# Patient Record
Sex: Female | Born: 1965 | Race: White | Hispanic: No | Marital: Married | Smoking: Current every day smoker
Health system: Southern US, Community
[De-identification: ages and names within clinical notes are randomized; demographics above are authoritative.]

## PROBLEM LIST (undated history)

## (undated) DIAGNOSIS — I10 Essential (primary) hypertension: Secondary | ICD-10-CM

## (undated) DIAGNOSIS — F419 Anxiety disorder, unspecified: Secondary | ICD-10-CM

## (undated) HISTORY — PX: TONSILLECTOMY: SUR1361

## (undated) HISTORY — PX: BILATERAL SALPINGECTOMY: SHX5743

---

## 2002-10-28 ENCOUNTER — Emergency Department (HOSPITAL_COMMUNITY): Admission: EM | Admit: 2002-10-28 | Discharge: 2002-10-28 | Payer: Self-pay | Admitting: Emergency Medicine

## 2010-01-29 ENCOUNTER — Emergency Department (HOSPITAL_COMMUNITY): Admission: EM | Admit: 2010-01-29 | Discharge: 2010-01-29 | Payer: Self-pay | Admitting: Emergency Medicine

## 2010-03-07 ENCOUNTER — Emergency Department: Payer: Self-pay | Admitting: Emergency Medicine

## 2010-04-26 ENCOUNTER — Emergency Department: Payer: Self-pay | Admitting: Emergency Medicine

## 2014-01-17 ENCOUNTER — Ambulatory Visit: Payer: Self-pay | Admitting: Internal Medicine

## 2014-06-21 ENCOUNTER — Emergency Department: Payer: Self-pay | Admitting: Emergency Medicine

## 2015-08-01 ENCOUNTER — Other Ambulatory Visit: Payer: Self-pay | Admitting: Family Medicine

## 2015-08-01 DIAGNOSIS — Z1231 Encounter for screening mammogram for malignant neoplasm of breast: Secondary | ICD-10-CM

## 2015-08-04 ENCOUNTER — Ambulatory Visit
Admission: RE | Admit: 2015-08-04 | Discharge: 2015-08-04 | Disposition: A | Discharge status: Home / Self Care | Payer: BLUE CROSS/BLUE SHIELD | Source: Ambulatory Visit | Attending: Family Medicine | Admitting: Family Medicine

## 2015-08-04 DIAGNOSIS — Z1231 Encounter for screening mammogram for malignant neoplasm of breast: Secondary | ICD-10-CM | POA: Diagnosis not present

## 2015-08-08 DIAGNOSIS — F418 Other specified anxiety disorders: Secondary | ICD-10-CM | POA: Insufficient documentation

## 2015-08-08 DIAGNOSIS — F17200 Nicotine dependence, unspecified, uncomplicated: Secondary | ICD-10-CM | POA: Insufficient documentation

## 2015-08-08 DIAGNOSIS — I10 Essential (primary) hypertension: Secondary | ICD-10-CM | POA: Insufficient documentation

## 2015-08-08 DIAGNOSIS — R9431 Abnormal electrocardiogram [ECG] [EKG]: Secondary | ICD-10-CM | POA: Insufficient documentation

## 2016-02-15 ENCOUNTER — Emergency Department
Admission: EM | Admit: 2016-02-15 | Discharge: 2016-02-15 | Disposition: A | Discharge status: Home / Self Care | Payer: BLUE CROSS/BLUE SHIELD | Attending: Emergency Medicine | Admitting: Emergency Medicine

## 2016-02-15 ENCOUNTER — Emergency Department: Payer: BLUE CROSS/BLUE SHIELD

## 2016-02-15 DIAGNOSIS — R109 Unspecified abdominal pain: Secondary | ICD-10-CM | POA: Diagnosis present

## 2016-02-15 DIAGNOSIS — M545 Low back pain, unspecified: Secondary | ICD-10-CM

## 2016-02-15 DIAGNOSIS — Z79899 Other long term (current) drug therapy: Secondary | ICD-10-CM | POA: Insufficient documentation

## 2016-02-15 DIAGNOSIS — F17210 Nicotine dependence, cigarettes, uncomplicated: Secondary | ICD-10-CM | POA: Diagnosis not present

## 2016-02-15 HISTORY — DX: Anxiety disorder, unspecified: F41.9

## 2016-02-15 LAB — URINALYSIS COMPLETE WITH MICROSCOPIC (ARMC ONLY)
BILIRUBIN URINE: NEGATIVE
Bacteria, UA: NONE SEEN
GLUCOSE, UA: NEGATIVE mg/dL
HGB URINE DIPSTICK: NEGATIVE
LEUKOCYTES UA: NEGATIVE
NITRITE: NEGATIVE
Protein, ur: 30 mg/dL — AB
SPECIFIC GRAVITY, URINE: 1.027 (ref 1.005–1.030)
pH: 5 (ref 5.0–8.0)

## 2016-02-15 LAB — CBC
HCT: 44.5 % (ref 35.0–47.0)
Hemoglobin: 15.2 g/dL (ref 12.0–16.0)
MCH: 30 pg (ref 26.0–34.0)
MCHC: 34.1 g/dL (ref 32.0–36.0)
MCV: 87.9 fL (ref 80.0–100.0)
PLATELETS: 255 10*3/uL (ref 150–440)
RBC: 5.06 MIL/uL (ref 3.80–5.20)
RDW: 14.5 % (ref 11.5–14.5)
WBC: 9.7 10*3/uL (ref 3.6–11.0)

## 2016-02-15 LAB — COMPREHENSIVE METABOLIC PANEL
ALK PHOS: 60 U/L (ref 38–126)
ALT: 20 U/L (ref 14–54)
AST: 25 U/L (ref 15–41)
Albumin: 4.5 g/dL (ref 3.5–5.0)
Anion gap: 9 (ref 5–15)
BILIRUBIN TOTAL: 0.5 mg/dL (ref 0.3–1.2)
BUN: 18 mg/dL (ref 6–20)
CALCIUM: 9.2 mg/dL (ref 8.9–10.3)
CHLORIDE: 104 mmol/L (ref 101–111)
CO2: 26 mmol/L (ref 22–32)
CREATININE: 0.9 mg/dL (ref 0.44–1.00)
Glucose, Bld: 91 mg/dL (ref 65–99)
Potassium: 4.1 mmol/L (ref 3.5–5.1)
Sodium: 139 mmol/L (ref 135–145)
TOTAL PROTEIN: 7.6 g/dL (ref 6.5–8.1)

## 2016-02-15 LAB — LIPASE, BLOOD: LIPASE: 21 U/L (ref 11–51)

## 2016-02-15 LAB — PREGNANCY, URINE: Preg Test, Ur: NEGATIVE

## 2016-02-15 MED ORDER — KETOROLAC TROMETHAMINE 60 MG/2ML IM SOLN
30.0000 mg | Freq: Once | INTRAMUSCULAR | Status: AC
  Administered 2016-02-15: 30 mg via INTRAMUSCULAR

## 2016-02-15 MED ORDER — HYDROCODONE-ACETAMINOPHEN 5-325 MG PO TABS: 1.0000 | ORAL_TABLET | ORAL | 0 refills | Status: DC | PRN

## 2016-02-15 MED ORDER — DOCUSATE SODIUM 100 MG PO CAPS: ORAL_CAPSULE | ORAL | 0 refills | Status: DC

## 2016-02-15 MED ORDER — KETOROLAC TROMETHAMINE 30 MG/ML IJ SOLN
INTRAMUSCULAR | Status: AC
  Administered 2016-02-15: 30 mg via INTRAMUSCULAR
  Filled 2016-02-15: qty 1

## 2016-02-15 NOTE — Discharge Instructions (Signed)
As we discussed, your workup today was reassuring.  Though we do not know exactly what is causing your symptoms, it appears that you have no emergent medical condition at this time and that you are safe to go home and follow up as recommended in this paperwork.  You most likely have muscle strain, and we encourage you to read through the included information and try some of the therapies including over-the-counter ibuprofen and heating pads.  Take Norco as prescribed for severe pain. Do not drink alcohol, drive or participate in any other potentially dangerous activities while taking this medication as it may make you sleepy. Do not take this medication with any other sedating medications, either prescription or over-the-counter. If you were prescribed Percocet or Vicodin, do not take these with acetaminophen (Tylenol) as it is already contained within these medications.   This medication is an opiate (or narcotic) pain medication and can be habit forming.  Use it as little as possible to achieve adequate pain control.  Do not use or use it with extreme caution if you have a history of opiate abuse or dependence.  If you are on a pain contract with your primary care doctor or a pain specialist, be sure to let them know you were prescribed this medication today from the [facility].  This medication is intended for your use only - do not give any to anyone else and keep it in a secure place where nobody else, especially children, have access to it.  It will also cause or worsen constipation, so you may want to consider taking an over-the-counter stool softener while you are taking this medication.  Please return immediately to the Emergency Department if you develop any new or worsening symptoms that concern you.

## 2016-02-15 NOTE — ED Provider Notes (Signed)
[facility] Emergency Department Provider Note  ____________________________________________   First MD Initiated Contact with Patient 02/15/16 1815     (approximate)  I have reviewed the triage vital signs and the nursing notes.   HISTORY  Chief Complaint Abdominal Pain and Flank Pain    HPI Miranda Harvey is a 50 y.o. female who reports acute onset of pain that seems to go from her right flank down to her right groin.  It has been present off and on for about 2 days.  She reports it is anywhere from mild to severe.  It is worse with ambulation and weightbearing and better at rest, although it does not only happened when she was moving around.  She describes it as both sharp and aching.  She denies dysuria, hematuria, nausea, vomiting, fever/chills, chest pain, shortness of breath, upper abdominal pain.  She has not had anything like it in the past.   Past Medical History:  Diagnosis Date  . Anxiety     There are no active problems to display for this patient.   Past Surgical History:  Procedure Laterality Date  . BILATERAL SALPINGECTOMY Bilateral     Prior to Admission medications   Medication Sig Start Date End Date Taking? Authorizing Provider  ALPRAZolam Prudy Feeler(XANAX) 0.5 MG tablet Take 0.5 mg by mouth at bedtime as needed for anxiety.   Yes Historical Provider, MD  calcium-vitamin D (OSCAL WITH D) 500-200 MG-UNIT tablet Take 1 tablet by mouth.   Yes Historical Provider, MD  escitalopram (LEXAPRO) 10 MG tablet Take 10 mg by mouth daily.   Yes Historical Provider, MD  losartan (COZAAR) 50 MG tablet Take 50 mg by mouth daily.   Yes Historical Provider, MD  vitamin B-12 (CYANOCOBALAMIN) 100 MCG tablet Take 100 mcg by mouth daily.   Yes Historical Provider, MD  docusate sodium (COLACE) 100 MG capsule Take 1 tablet once or twice daily as needed for constipation while taking narcotic pain medicine 02/15/16   Loleta Roseory Kron Everton, MD  HYDROcodone-acetaminophen  (NORCO/VICODIN) 5-325 MG tablet Take 1-2 tablets by mouth every 4 (four) hours as needed for moderate pain. 02/15/16   Loleta Roseory Carlisle Torgeson, MD    Allergies Review of patient's allergies indicates no known allergies.  Family History  Problem Relation Age of Onset  . Diabetes Son   . Cancer Maternal Grandfather   . Cancer Cousin   . Breast cancer Neg Hx     Social History Social History  Substance Use Topics  . Smoking status: Current Every Day Smoker    Packs/day: 1.50    Years: 17.00    Types: Cigarettes  . Smokeless tobacco: Never Used  . Alcohol use No    Review of Systems Constitutional: No fever/chills Eyes: No visual changes. ENT: No sore throat. Cardiovascular: Denies chest pain. Respiratory: Denies shortness of breath. Gastrointestinal: Pain radiating in and around her right flank down to her right groin Genitourinary: Negative for dysuria.  Negative for hematuria. Musculoskeletal: Negative for back pain. Skin: Negative for rash. Neurological: Negative for headaches, focal weakness or numbness.  10-point ROS otherwise negative.  ____________________________________________   PHYSICAL EXAM:  VITAL SIGNS: ED Triage Vitals  Enc Vitals Group     BP 02/15/16 1655 (!) 150/92     Pulse Rate 02/15/16 1655 73     Resp 02/15/16 1655 18     Temp 02/15/16 1655 98.5 F (36.9 C)     Temp Source 02/15/16 1655 Oral     SpO2 02/15/16  1655 97 %     Weight 02/15/16 1655 165 lb (74.8 kg)     Height 02/15/16 1655 5\' 5"  (1.651 m)     Head Circumference --      Peak Flow --      Pain Score 02/15/16 1656 10     Pain Loc --      Pain Edu? --      Excl. in GC? --     Constitutional: Alert and oriented. Well appearing and in no acute distress. Eyes: Conjunctivae are normal. PERRL. EOMI. Head: Atraumatic. Nose: No congestion/rhinnorhea. Mouth/Throat: Mucous membranes are moist.  Oropharynx non-erythematous. Neck: No stridor.  No meningeal signs.   Cardiovascular: Normal  rate, regular rhythm. Good peripheral circulation. Grossly normal heart sounds. Respiratory: Normal respiratory effort.  No retractions. Lungs CTAB. Gastrointestinal: Soft With no right lower quadrant tenderness to palpation.  She has right CVA tenderness that is moderate and easily reproducible with palpation and percussion.  She has no evidence of an inguinal or abdominal hernia. Musculoskeletal: No lower extremity tenderness nor edema. No gross deformities of extremities. Neurologic:  Normal speech and language. No gross focal neurologic deficits are appreciated.  Skin:  Skin is warm, dry and intact. No rash noted. Psychiatric: Mood and affect are normal. Speech and behavior are normal.  ____________________________________________   LABS (all labs ordered are listed, but only abnormal results are displayed)  Labs Reviewed  URINALYSIS COMPLETEWITH MICROSCOPIC ([facility] ONLY) - Abnormal; Notable for the following:       Result Value   Color, Urine YELLOW (*)    APPearance CLEAR (*)    Ketones, ur TRACE (*)    Protein, ur 30 (*)    Squamous Epithelial / LPF 0-5 (*)    All other components within normal limits  LIPASE, BLOOD  COMPREHENSIVE METABOLIC PANEL  CBC  PREGNANCY, URINE   ____________________________________________  EKG  None - EKG not ordered by ED physician ____________________________________________  RADIOLOGY   Ct Renal Stone Study  Result Date: 02/15/2016 CLINICAL DATA:  Right flank pain radiating to right groin. EXAM: CT ABDOMEN AND PELVIS WITHOUT CONTRAST TECHNIQUE: Multidetector CT imaging of the abdomen and pelvis was performed following the standard protocol without IV contrast. COMPARISON:  None. FINDINGS: Lower chest: No acute findings. Gallbladder is unremarkable. Hepatobiliary:  No mass visualized on this unenhanced exam. Pancreas: No mass or inflammatory process visualized on this unenhanced exam. Spleen:  Within normal limits in size. Adrenals/Urinary  tract: No evidence of urolithiasis or hydronephrosis. Unremarkable appearance of bladder. Stomach/Bowel: No evidence of obstruction, inflammatory process, or abnormal fluid collections. Normal appendix visualized. Vascular/Lymphatic: No pathologically enlarged lymph nodes identified. No evidence of abdominal aortic aneurysm. Aortic atherosclerosis. Reproductive:  Normal appearance of uterus and adnexa. Other:  None. Musculoskeletal:  No suspicious bone lesions identified. IMPRESSION: No evidence of urolithiasis, hydronephrosis, or other acute findings within the abdomen or pelvis. Aortic atherosclerosis. Electronically Signed   By: Myles Rosenthal M.D.   On: 02/15/2016 19:11    ____________________________________________   PROCEDURES  Procedure(s) performed:   Procedures   Critical Care performed: No ____________________________________________   INITIAL IMPRESSION / ASSESSMENT AND PLAN / ED COURSE  Pertinent labs & imaging results that were available during my care of the patient were reviewed by me and considered in my medical decision making (see chart for details).  Vital signs are stable and lab work is unremarkable.  Her signs and symptoms suggest kidney stones but she also may simply have musculoskeletal strain given that  it is worse with movement, weightbearing, and palpation.  However given the persistent discomfort I will evaluate with a CT renal study protocol and I am giving her Toradol 30 mg intramuscular to help with her discomfort.  She is comfortable with the plan at this time.   Clinical Course  Comment By Time  CT scan was unremarkable.  The patient's vital signs are stable and the initial triage tachycardia has resolved.  She is feeling better after the Toradol.  I think that musculoskeletal strength is the most likely cause of her reproducible tenderness and pain.  I encouraged her to follow up as an outpatient.  I encouraged her to continue taking ibuprofen and Tylenol  according to label directions I also gave her a prescription for a few Norco.  There is no evidence of acute or emergent medical condition at this time.  Loleta Rose, MD 10/26 1944    ____________________________________________  FINAL CLINICAL IMPRESSION(S) / ED DIAGNOSES  Final diagnoses:  Right flank pain  Low back pain radiating to right lower extremity     MEDICATIONS GIVEN DURING THIS VISIT:  Medications  ketorolac (TORADOL) injection 30 mg (30 mg Intramuscular Given 02/15/16 1850)     NEW OUTPATIENT MEDICATIONS STARTED DURING THIS VISIT:  New Prescriptions   DOCUSATE SODIUM (COLACE) 100 MG CAPSULE    Take 1 tablet once or twice daily as needed for constipation while taking narcotic pain medicine   HYDROCODONE-ACETAMINOPHEN (NORCO/VICODIN) 5-325 MG TABLET    Take 1-2 tablets by mouth every 4 (four) hours as needed for moderate pain.    Modified Medications   No medications on file    Discontinued Medications   No medications on file     Note:  This document was prepared using Dragon voice recognition software and may include unintentional dictation errors.    Loleta Rose, MD 02/15/16 1945

## 2016-02-15 NOTE — ED Triage Notes (Signed)
Right leg pain that radiates to right lower back and right groin, worse with movement X 2 days. Pt alert and oriented X4, active, cooperative, pt in NAD. RR even and unlabored, color WNL.

## 2016-09-05 DIAGNOSIS — I119 Hypertensive heart disease without heart failure: Secondary | ICD-10-CM | POA: Insufficient documentation

## 2016-10-04 DIAGNOSIS — I6523 Occlusion and stenosis of bilateral carotid arteries: Secondary | ICD-10-CM | POA: Insufficient documentation

## 2016-10-04 DIAGNOSIS — E7800 Pure hypercholesterolemia, unspecified: Secondary | ICD-10-CM | POA: Insufficient documentation

## 2016-11-15 ENCOUNTER — Ambulatory Visit (INDEPENDENT_AMBULATORY_CARE_PROVIDER_SITE_OTHER): Payer: BLUE CROSS/BLUE SHIELD | Admitting: Podiatry

## 2016-11-15 ENCOUNTER — Ambulatory Visit: Payer: BLUE CROSS/BLUE SHIELD

## 2016-11-15 VITALS — BP 126/92 | HR 66 | Temp 98.8°F | Resp 16

## 2016-11-15 DIAGNOSIS — L600 Ingrowing nail: Secondary | ICD-10-CM | POA: Diagnosis not present

## 2016-11-15 DIAGNOSIS — M79671 Pain in right foot: Secondary | ICD-10-CM

## 2016-11-15 DIAGNOSIS — M79672 Pain in left foot: Principal | ICD-10-CM

## 2016-11-15 NOTE — Progress Notes (Signed)
Subjective:     Patient ID: Miranda Harvey, female   DOB: September 25, 1965, 51 y.o.   MRN: [id number]  HPI   Review of Systems  Constitutional: Positive for fatigue.  Cardiovascular: Positive for chest pain.  Gastrointestinal: Positive for constipation and diarrhea.  Neurological: Positive for dizziness and numbness.  All other systems reviewed and are negative.      Objective:   Physical Exam     Assessment:         Plan:

## 2016-11-17 NOTE — Progress Notes (Signed)
Patient ID: Miranda Harvey, female   DOB: [birth year], 51 y.o.   MRN: [id number]   Subjective: Patient presents today for evaluation of pain in toe(s). Patient is concerned for possible ingrown nail. Patient states that the pain has been present for a few weeks now. Patient presents today for further treatment and evaluation.  Objective:  General: Well developed, nourished, in no acute distress, alert and oriented x3   Dermatology: Skin is warm, dry and supple bilateral. Medial border of the bilateral great toes appears to be erythematous with evidence of an ingrowing nail. Pain on palpation noted to the border of the nail fold. The remaining nails appear unremarkable at this time. There are no open sores, lesions.  Vascular: Dorsalis Pedis artery and Posterior Tibial artery pedal pulses palpable. No lower extremity edema noted.   Neruologic: Grossly intact via light touch bilateral.  Musculoskeletal: Muscular strength within normal limits in all groups bilateral. Normal range of motion noted to all pedal and ankle joints.   Assesement: #1 Paronychia with ingrowing nail medial border of the bilateral great toes #2 Pain in toe #3 Incurvated nail  Plan of Care:  1. Patient evaluated.  2. Discussed treatment alternatives and plan of care. Explained nail avulsion procedure and post procedure course to patient. 3. Patient opted for permanent partial nail avulsion.  4. Prior to procedure, local anesthesia infiltration utilized using 3 ml of a 50:50 mixture of 2% plain lidocaine and 0.5% plain marcaine in a normal hallux block fashion and a betadine prep performed.  5. Partial permanent nail avulsion with chemical matrixectomy performed using 3x30sec applications of phenol followed by alcohol flush.  6. Light dressing applied. 7. Metatarsal pads were dispensed today to alleviate forefoot pressure unrelated to ingrown toenails 8. Recommend over-the-counter insoles at [employer] 9.Return to  clinic in 2 weeks.   Felecia ShellingBrent M. Latrail Pounders, DPM [facility]  Dr. Felecia ShellingBrent M. Kaymen Adrian, DPM    [address]                                        [place], [place] [address]                Office [phone]  Fax [phone]

## 2017-02-10 ENCOUNTER — Emergency Department
Admission: EM | Admit: 2017-02-10 | Discharge: 2017-02-10 | Disposition: A | Discharge status: Home / Self Care | Payer: BLUE CROSS/BLUE SHIELD | Attending: Emergency Medicine | Admitting: Emergency Medicine

## 2017-02-10 ENCOUNTER — Encounter: Payer: Self-pay | Admitting: Emergency Medicine

## 2017-02-10 DIAGNOSIS — Z79899 Other long term (current) drug therapy: Secondary | ICD-10-CM | POA: Insufficient documentation

## 2017-02-10 DIAGNOSIS — F17210 Nicotine dependence, cigarettes, uncomplicated: Secondary | ICD-10-CM | POA: Diagnosis not present

## 2017-02-10 DIAGNOSIS — I119 Hypertensive heart disease without heart failure: Secondary | ICD-10-CM | POA: Diagnosis not present

## 2017-02-10 DIAGNOSIS — R197 Diarrhea, unspecified: Secondary | ICD-10-CM

## 2017-02-10 LAB — CBC WITH DIFFERENTIAL/PLATELET
BASOS ABS: 0.1 10*3/uL (ref 0–0.1)
Basophils Relative: 1 %
EOS PCT: 3 %
Eosinophils Absolute: 0.2 10*3/uL (ref 0–0.7)
HCT: 41 % (ref 35.0–47.0)
Hemoglobin: 14.2 g/dL (ref 12.0–16.0)
LYMPHS PCT: 18 %
Lymphs Abs: 1.6 10*3/uL (ref 1.0–3.6)
MCH: 30.8 pg (ref 26.0–34.0)
MCHC: 34.6 g/dL (ref 32.0–36.0)
MCV: 89.2 fL (ref 80.0–100.0)
MONO ABS: 0.8 10*3/uL (ref 0.2–0.9)
Monocytes Relative: 9 %
Neutro Abs: 6.2 10*3/uL (ref 1.4–6.5)
Neutrophils Relative %: 69 %
PLATELETS: 282 10*3/uL (ref 150–440)
RBC: 4.6 MIL/uL (ref 3.80–5.20)
RDW: 14.4 % (ref 11.5–14.5)
WBC: 8.9 10*3/uL (ref 3.6–11.0)

## 2017-02-10 LAB — COMPREHENSIVE METABOLIC PANEL
ALT: 16 U/L (ref 14–54)
AST: 19 U/L (ref 15–41)
Albumin: 3.7 g/dL (ref 3.5–5.0)
Alkaline Phosphatase: 79 U/L (ref 38–126)
Anion gap: 7 (ref 5–15)
BUN: 12 mg/dL (ref 6–20)
CHLORIDE: 101 mmol/L (ref 101–111)
CO2: 29 mmol/L (ref 22–32)
Calcium: 9.1 mg/dL (ref 8.9–10.3)
Creatinine, Ser: 1.03 mg/dL — ABNORMAL HIGH (ref 0.44–1.00)
Glucose, Bld: 97 mg/dL (ref 65–99)
POTASSIUM: 3 mmol/L — AB (ref 3.5–5.1)
SODIUM: 137 mmol/L (ref 135–145)
Total Bilirubin: 0.5 mg/dL (ref 0.3–1.2)
Total Protein: 7.2 g/dL (ref 6.5–8.1)

## 2017-02-10 MED ORDER — LOPERAMIDE HCL 2 MG PO TABS: 2.0000 mg | ORAL_TABLET | Freq: Four times a day (QID) | ORAL | 0 refills | Status: DC | PRN

## 2017-02-10 NOTE — ED Provider Notes (Signed)
[facility] Emergency Department Provider Note  Time seen: 10:08 AM  I have reviewed the triage vital signs and the nursing notes.   HISTORY  Chief Complaint Diarrhea    HPI Miranda Harvey is a 51 y.o. female with a past medical history of anxiety, hypertension, presents to the emergency department for diarrhea.  According to the patient for the past 2-3 weeks she has been experiencing daily episodes of diarrhea approximately 5 episodes each day.  Denies any black or blood in her stool.  Denies any nausea or vomiting.  States occasional abdominal cramping but this resolves after a bowel movement.  Denies any "abdominal pain."  Patient states she was at work today and had a leave her job due to diarrhea so she came to the emergency department for evaluation.   Past Medical History:  Diagnosis Date  . Anxiety     Patient Active Problem List   Diagnosis Date Noted  . Bilateral carotid artery stenosis 10/04/2016  . Pure hypercholesterolemia 10/04/2016  . LVH (left ventricular hypertrophy) due to hypertensive disease, without heart failure 09/05/2016  . Abnormal EKG 08/08/2015  . Current smoker 08/08/2015  . Depression with anxiety 08/08/2015  . Essential hypertension 08/08/2015    Past Surgical History:  Procedure Laterality Date  . BILATERAL SALPINGECTOMY Bilateral     Prior to Admission medications   Medication Sig Start Date End Date Taking? Authorizing Provider  ALPRAZolam Prudy Feeler) 0.5 MG tablet Take 0.5 mg by mouth at bedtime as needed for anxiety.    [provider]  escitalopram (LEXAPRO) 20 MG tablet Take by mouth. 09/05/16   [provider]  hydrochlorothiazide (HYDRODIURIL) 25 MG tablet Take by mouth. 10/04/16 10/04/17  [provider]  losartan (COZAAR) 100 MG tablet Take by mouth. 09/05/16   [provider]  pravastatin (PRAVACHOL) 10 MG tablet Take by mouth. 10/04/16 10/04/17  [provider]    No  Known Allergies  Family History  Problem Relation Age of Onset  . Diabetes Son   . Cancer Maternal Grandfather   . Cancer Cousin   . Breast cancer Neg Hx     Social History Social History  Substance Use Topics  . Smoking status: Current Every Day Smoker    Packs/day: 1.50    Years: 17.00    Types: Cigarettes  . Smokeless tobacco: Never Used  . Alcohol use No    Review of Systems Constitutional: Negative for fever. Cardiovascular: Negative for chest pain. Respiratory: Negative for shortness of breath. Gastrointestinal: Intermittent abdominal cramping.  Positive for diarrhea.  Negative for nausea or vomiting Genitourinary: Negative for dysuria. Neurological: Negative for headache All other ROS negative  ____________________________________________   PHYSICAL EXAM:  VITAL SIGNS: ED Triage Vitals  Enc Vitals Group     BP 02/10/17 0715 (!) 129/95     Pulse Rate 02/10/17 0715 70     Resp 02/10/17 0715 18     Temp 02/10/17 0715 98.8 F (37.1 C)     Temp Source 02/10/17 0715 Oral     SpO2 02/10/17 0715 100 %     Weight 02/10/17 0716 179 lb (81.2 kg)     Height 02/10/17 0716 5\' 5"  (1.651 m)     Head Circumference --      Peak Flow --      Pain Score 02/10/17 0715 5     Pain Loc --      Pain Edu? --      Excl. in GC? --  Constitutional: Alert and oriented. Well appearing and in no distress. Eyes: Normal exam ENT   Head: Normocephalic and atraumatic.   Mouth/Throat: Mucous membranes are moist. Cardiovascular: Normal rate, regular rhythm. No murmur Respiratory: Normal respiratory effort without tachypnea nor retractions. Breath sounds are clear  Gastrointestinal: Soft and nontender. No distention.  Musculoskeletal: Nontender with normal range of motion in all extremities.  Neurologic:  Normal speech and language. No gross focal neurologic deficits  Skin:  Skin is warm, dry and intact.  Psychiatric: Mood and affect are normal.    ____________________________________________   INITIAL IMPRESSION / ASSESSMENT AND PLAN / ED COURSE  Pertinent labs & imaging results that were available during my care of the patient were reviewed by me and considered in my medical decision making (see chart for details).  The patient presents to the emergency department with continued diarrhea times 2-3 weeks.  Differential would include C. difficile, infectious/invasive diarrhea, colitis or diverticulitis, gastroenteritis/enteritis.  Overall the patient appears very well, her labs are normal including her white blood cell count.  No signs of significant dehydration on chemistry.  Patient has a nontender abdominal exam.  She does state approximately 3 weeks ago she had an upper respiratory infection in which she took antibiotics.  Given her recent antibiotic usage and continued diarrhea we will check a C. difficile PCR.  We will also obtain stool sample for stool antigen testing.  Patient agreeable to this plan of care.  Patient has been unable to produce a stool sample in the emergency department and is now asking to be discharged home.  Patient's labs are normal otherwise.  I discussed using loperamide and following up with her doctor.  Patient agreeable to this plan.  ____________________________________________   FINAL CLINICAL IMPRESSION(S) / ED DIAGNOSES  Diarrhea    Minna AntisPaduchowski, Nishaan Stanke, MD 02/10/17 1123

## 2017-02-10 NOTE — ED Triage Notes (Signed)
Diarrhea x 3 weeks, only has abdominal pain with cramping type only with episodes of diarrhea, not in between. Denies fevers.

## 2017-02-10 NOTE — ED Notes (Signed)
MD in room to assess patient at this time.   

## 2017-02-10 NOTE — ED Notes (Signed)
Pt came out to door stating that she could not give stool sample at this time and that she wants to go home. Dr. Lenard LancePaduchowski updated.

## 2018-12-16 ENCOUNTER — Other Ambulatory Visit: Payer: Self-pay | Admitting: Family Medicine

## 2018-12-16 DIAGNOSIS — Z1231 Encounter for screening mammogram for malignant neoplasm of breast: Secondary | ICD-10-CM

## 2019-01-22 ENCOUNTER — Encounter: Payer: Self-pay | Admitting: Radiology

## 2019-01-22 ENCOUNTER — Ambulatory Visit
Admission: RE | Admit: 2019-01-22 | Discharge: 2019-01-22 | Disposition: A | Discharge status: Home / Self Care | Payer: BC Managed Care – PPO | Source: Ambulatory Visit | Attending: Family Medicine | Admitting: Family Medicine

## 2019-01-22 DIAGNOSIS — Z1231 Encounter for screening mammogram for malignant neoplasm of breast: Secondary | ICD-10-CM

## 2019-08-06 ENCOUNTER — Ambulatory Visit: Payer: Self-pay | Attending: Internal Medicine

## 2019-08-06 DIAGNOSIS — Z23 Encounter for immunization: Secondary | ICD-10-CM

## 2019-08-06 NOTE — Progress Notes (Signed)
   Covid-19 Vaccination Clinic  Name:  Miranda Harvey    MRN: [id number] DOB: Dec 18, 1965  08/06/2019  [name] was observed post Covid-19 immunization for 15 minutes without incident. She was provided with Vaccine Information Sheet and instruction to access the V-Safe system.   [name] was instructed to call 911 with any severe reactions post vaccine: Marland Kitchen Difficulty breathing  . Swelling of face and throat  . A fast heartbeat  . A bad rash all over body  . Dizziness and weakness   Immunizations Administered    Name Date Dose VIS Date Route   Pfizer COVID-19 Vaccine 08/06/2019  9:40 AM 0.3 mL 04/02/2019 Intramuscular   Manufacturer: [employer], [employer]   Lot: [id number]   NDC: [address]-8

## 2019-08-31 ENCOUNTER — Ambulatory Visit: Payer: Self-pay | Attending: Internal Medicine

## 2019-08-31 DIAGNOSIS — Z23 Encounter for immunization: Secondary | ICD-10-CM

## 2019-08-31 NOTE — Progress Notes (Signed)
   Covid-19 Vaccination Clinic  Name:  Miranda Harvey    MRN: [id number] DOB: 10/21/1965  08/31/2019  [name] was observed post Covid-19 immunization for 15 minutes without incident. She was provided with Vaccine Information Sheet and instruction to access the V-Safe system.   [name] was instructed to call 911 with any severe reactions post vaccine: Marland Kitchen Difficulty breathing  . Swelling of face and throat  . A fast heartbeat  . A bad rash all over body  . Dizziness and weakness   Immunizations Administered    Name Date Dose VIS Date Route   Pfizer COVID-19 Vaccine 08/31/2019  8:42 AM 0.3 mL 06/16/2018 Intramuscular   Manufacturer: [employer], [employer]   Lot: [id number]   NDC: [address]-3

## 2020-01-12 ENCOUNTER — Other Ambulatory Visit: Payer: Self-pay | Admitting: Internal Medicine

## 2020-01-12 DIAGNOSIS — Z1231 Encounter for screening mammogram for malignant neoplasm of breast: Secondary | ICD-10-CM

## 2020-01-31 ENCOUNTER — Other Ambulatory Visit: Payer: Self-pay

## 2020-01-31 ENCOUNTER — Ambulatory Visit
Admission: RE | Admit: 2020-01-31 | Discharge: 2020-01-31 | Disposition: A | Discharge status: Home / Self Care | Payer: BC Managed Care – PPO | Source: Ambulatory Visit | Attending: Internal Medicine | Admitting: Internal Medicine

## 2020-01-31 DIAGNOSIS — Z1231 Encounter for screening mammogram for malignant neoplasm of breast: Secondary | ICD-10-CM

## 2020-11-30 ENCOUNTER — Ambulatory Visit: Payer: Self-pay | Admitting: Orthopedic Surgery

## 2020-12-14 NOTE — Pre-Procedure Instructions (Signed)
Surgical Instructions   Your procedure is scheduled on Thursday, September 1st. Report to [facility] Main Entrance "A" at 05:30 A.M., then check in with the Admitting office. Call this number if you have problems the morning of surgery: [phone]   If you have any questions prior to your surgery date call [phone]: Open Monday-Friday 8am-4pm   Remember: Do not eat after midnight the night before your surgery  You may drink clear liquids until 04:30 the morning of your surgery.   Clear liquids allowed are: Water, Non-Citrus Juices (without pulp), Carbonated Beverages, Clear Tea, Black Coffee with (NO MILK, CREAM OR POWDERED CREAMER), and Gatorade   Patient Instructions  The night before surgery:  No food after midnight. ONLY clear liquids after midnight  The day of surgery (if you do NOT have diabetes):  Drink ONE (1) Pre-Surgery Clear Ensure by 04:30 the morning of surgery. Drink in one sitting. Do not sip.  This drink was given to you during your hospital  pre-op appointment visit.  Nothing else to drink after completing the  Pre-Surgery Clear Ensure.  If you have questions, please contact your surgeon's office.    Take these medicines the morning of surgery with A SIP OF WATER  amLODipine (NORVASC) cetirizine (ZYRTEC)  pravastatin (PRAVACHOL) venlafaxine XR (EFFEXOR-XR)   As of today, STOP taking any Aspirin (unless otherwise instructed by your surgeon) Aleve, Naproxen, Ibuprofen, Motrin, Advil, Goody's, BC's, all herbal medications, fish oil, and all vitamins.           Do not wear jewelry or makeup Do not wear lotions, powders, perfumes or deodorant. Do not shave 48 hours prior to surgery.   Do not bring valuables to the hospital. [facility] is not responsible for any belongings or valuables. DO Not wear nail polish, gel polish, artificial nails, or any other type of covering on natural nails including finger and toenails. If patients have artificial nails,  gel coating, etc. that need to be removed by a nail salon please have this removed prior to surgery or surgery may need to be canceled/delayed if the surgeon/ anesthesia feels like the patient is unable to be adequately monitored.               Do NOT Smoke (Tobacco/Vaping) or drink Alcohol 24 hours prior to your procedure If you use a CPAP at night, you may bring all equipment for your overnight stay.   Contacts, glasses, dentures or bridgework may not be worn into surgery, please bring cases for these belongings   For patients admitted to the hospital, discharge time will be determined by your treatment team.   Patients discharged the day of surgery will not be allowed to drive home, and someone needs to stay with them for 24 hours.  ONLY 1 SUPPORT PERSON MAY BE PRESENT WHILE YOU ARE IN SURGERY. IF YOU ARE TO BE ADMITTED ONCE YOU ARE IN YOUR ROOM YOU WILL BE ALLOWED TWO (2) VISITORS.  Minor children may have two parents present. Special consideration for safety and communication needs will be reviewed on a case by case basis.  Special instructions:    Oral Hygiene is also important to reduce your risk of infection.  Remember - BRUSH YOUR TEETH THE MORNING OF SURGERY WITH YOUR REGULAR TOOTHPASTE   Coffee- Preparing For Surgery  Before surgery, you can play an important role. Because skin is not sterile, your skin needs to be as free of germs as possible. You can reduce the number of  germs on your skin by washing with CHG (chlorahexidine gluconate) Soap before surgery.  CHG is an antiseptic cleaner which kills germs and bonds with the skin to continue killing germs even after washing.     Please do not use if you have an allergy to CHG or antibacterial soaps. If your skin becomes reddened/irritated stop using the CHG.  Do not shave (including legs and underarms) for at least 48 hours prior to first CHG shower. It is OK to shave your face.  Please follow these instructions  carefully.     Shower the NIGHT BEFORE SURGERY and the MORNING OF SURGERY with CHG Soap.   If you chose to wash your hair, wash your hair first as usual with your normal shampoo. After you shampoo, rinse your hair and body thoroughly to remove the shampoo.  Then [employer] and genitals (private parts) with your normal soap and rinse thoroughly to remove soap.  After that Use CHG Soap as you would any other liquid soap. You can apply CHG directly to the skin and wash gently with a scrungie or a clean washcloth.   Apply the CHG Soap to your body ONLY FROM THE NECK DOWN.  Do not use on open wounds or open sores. Avoid contact with your eyes, ears, mouth and genitals (private parts). Wash Face and genitals (private parts)  with your normal soap.   Wash thoroughly, paying special attention to the area where your surgery will be performed.  Thoroughly rinse your body with warm water from the neck down.  DO NOT shower/wash with your normal soap after using and rinsing off the CHG Soap.  Pat yourself dry with a CLEAN TOWEL.  Wear CLEAN PAJAMAS to bed the night before surgery  Place CLEAN SHEETS on your bed the night before your surgery  DO NOT SLEEP WITH PETS.   Day of Surgery:  Take a shower with CHG soap. Wear Clean/Comfortable clothing the morning of surgery Do not apply any deodorants/lotions.   Remember to brush your teeth WITH YOUR REGULAR TOOTHPASTE.   Please read over the following fact sheets that you were given.

## 2020-12-15 ENCOUNTER — Other Ambulatory Visit: Payer: Self-pay

## 2020-12-15 ENCOUNTER — Encounter (HOSPITAL_COMMUNITY): Payer: Self-pay

## 2020-12-15 ENCOUNTER — Ambulatory Visit (HOSPITAL_COMMUNITY)
Admission: RE | Admit: 2020-12-15 | Discharge: 2020-12-15 | Disposition: A | Discharge status: Home / Self Care | Payer: BC Managed Care – PPO | Source: Ambulatory Visit | Attending: Orthopedic Surgery | Admitting: Orthopedic Surgery

## 2020-12-15 ENCOUNTER — Encounter (HOSPITAL_COMMUNITY)
Admission: RE | Admit: 2020-12-15 | Discharge: 2020-12-15 | Disposition: A | Discharge status: Home / Self Care | Payer: BC Managed Care – PPO | Source: Ambulatory Visit | Attending: Specialist | Admitting: Specialist

## 2020-12-15 DIAGNOSIS — M5126 Other intervertebral disc displacement, lumbar region: Secondary | ICD-10-CM

## 2020-12-15 HISTORY — DX: Essential (primary) hypertension: I10

## 2020-12-15 LAB — CBC
HCT: 49.3 % — ABNORMAL HIGH (ref 36.0–46.0)
Hemoglobin: 16.1 g/dL — ABNORMAL HIGH (ref 12.0–15.0)
MCH: 30.1 pg (ref 26.0–34.0)
MCHC: 32.7 g/dL (ref 30.0–36.0)
MCV: 92.3 fL (ref 80.0–100.0)
Platelets: 465 10*3/uL — ABNORMAL HIGH (ref 150–400)
RBC: 5.34 MIL/uL — ABNORMAL HIGH (ref 3.87–5.11)
RDW: 14.3 % (ref 11.5–15.5)
WBC: 11.1 10*3/uL — ABNORMAL HIGH (ref 4.0–10.5)
nRBC: 0 % (ref 0.0–0.2)

## 2020-12-15 LAB — SURGICAL PCR SCREEN
MRSA, PCR: NEGATIVE
Staphylococcus aureus: NEGATIVE

## 2020-12-15 LAB — BASIC METABOLIC PANEL
Anion gap: 8 (ref 5–15)
BUN: 10 mg/dL (ref 6–20)
CO2: 29 mmol/L (ref 22–32)
Calcium: 9.4 mg/dL (ref 8.9–10.3)
Chloride: 102 mmol/L (ref 98–111)
Creatinine, Ser: 0.95 mg/dL (ref 0.44–1.00)
GFR, Estimated: >60 mL/min (ref >60)
Glucose, Bld: 98 mg/dL (ref 70–99)
Potassium: 3.7 mmol/L (ref 3.5–5.1)
Sodium: 139 mmol/L (ref 135–145)

## 2020-12-15 NOTE — Progress Notes (Addendum)
PCP - Enid Baas Cardiologist - denies  Chest x-ray - 12/15/20 EKG - 12/15/20 Stress Test - 2017 ECHO - 2017  ERAS Protcol - yes, Ensure ordered & given   COVID TEST- n/a (ambulatory surgery)   Anesthesia review: yes, history of cardiac workup  Patient denies shortness of breath, fever, cough and chest pain at PAT appointment   All instructions explained to the patient, with a verbal understanding of the material. Patient agrees to go over the instructions while at home for a better understanding. Patient also instructed to self quarantine after being tested for COVID-19. The opportunity to ask questions was provided.

## 2020-12-18 NOTE — Progress Notes (Addendum)
Anesthesia Chart Review:  Case: [id number] Date/Time: 12/21/20 0715   Procedure: Microlumbar decompression L5-S1 left (Left)   Anesthesia type: Choice   Pre-op diagnosis: stenosis L5-S1 left   Location: [facility] ROOM 19 / [facility] OR   Surgeons: Jene Every, MD       DISCUSSION: Patient is a 55 year old female scheduled for the above procedure.   History includes smoking, HTN, anxiety.   She had preoperative medical evaluation by Enid Baas, MD on 11/24/20 (see [facility]). States patient without chest pain or SOB and was able to doe METS of 10 prior to back pain.  Per Dr. Nemiah Commander, "Preop exam- -Labs reviewed, review of systems discussed. -No cardiac risk factors. Low risk for surgery. Can proceed with the Surgery. -Advised to take her blood pressure medications daily -Stay away from aspirin and BC powders."  Her preoperative EKG shows SR, LVH with repolarization abnormality. By notes, this is not new (present ~ 2017-2018) and had unremarkable stress test and echo in 2017. Will attempt to get copy of old EKG tracing(s), otherwise anesthesia to evaluate on the day of surgery. She denies shortness of breath, cough, fever, chest pain at PAT RN visit. (UPDATE 12/19/20 3:29 PM: Received 09/05/16 EKG and 08/09/15 EKGs during stress test from [facility]. Repolarization abnormality is noted on both tracings, but more prominent on pre-stress test EKG 08/09/15, particularly in inferior leads. V6 abnormality is more prominent on 12/15/20 tracing but otherwise I don't think tracing is significantly changed since 09/05/16.)    VS: BP (!) 135/96   Pulse 88   Temp 37.1 C (Oral)   Resp 18   Ht 5\' 6"  (1.676 m)   Wt 83.6 kg   LMP  (LMP Unknown)   SpO2 99%   BMI 29.75 kg/m   PROVIDERS: , MD is PCP  - She is not followed by cardiology routinely, but was evaluated by [facility], MD in 2017 for abnormal EKG, episode of near syncope and left chest numbness, and  SOB. Stress and echo were unremarkable.    LABS: Labs reviewed: Acceptable for surgery. (all labs ordered are listed, but only abnormal results are displayed)  Labs Reviewed  CBC - Abnormal; Notable for the following components:      Result Value   WBC 11.1 (*)    RBC 5.34 (*)    Hemoglobin 16.1 (*)    HCT 49.3 (*)    Platelets 465 (*)    All other components within normal limits  SURGICAL PCR SCREEN  BASIC METABOLIC PANEL     IMAGES: Xray L-spine 12/15/20: FINDINGS: - Slight dextrocurvature of the lower thoracic and lumbar spine. Otherwise normal alignment. Preserved vertebral body heights. Slight disc space narrowing at L2-3 and L5-S1. - Facets are aligned. Aortoiliac bifurcation atherosclerosis noted. Normal SI joints for age. Included pelvis unremarkable. Nonobstructive bowel gas pattern.   IMPRESSION: No acute finding by plain radiography. Degenerative disc disease, as above.   EKG: 12/15/20: Normal sinus rhythm Left ventricular hypertrophy with repolarization abnormality ( Sokolow-Lyon ) Abnormal ECG Confirmed by 12/17/20 (Hillis Range) on 12/15/2020 10:11:48 PM - Attempting to get a copy of 2018 EKG from [facility], but based on Narrative 09/05/16 tracing showed NSR, LVH with repolarization abornality as well which was not significantly changed from 08/08/15 tracing. She had non-ischemic stress test 08/09/15. See DISCUSSION.   CV: Echo 09/01/15 ([facility]): INTERPRETATION  NORMAL LEFT VENTRICULAR SYSTOLIC FUNCTION WITH MILD LVH  MILD VALVULAR REGURGITATION (Mild MR, Mild  AR, Mild TR)  NO VALVULAR STENOSIS  AORTIC VALVE SCLEROTIC  EF 55%    Nuclear stress test 08/09/15 ([facility]): Impression: Normal myocardial perfusion scan no evidence of stress-induced  myocardial ischemia ejection fraction of 52%.  Conclusion negative scan.   Past Medical History:  Diagnosis Date   Anxiety    Hypertension     Past Surgical History:  Procedure Laterality Date    BILATERAL SALPINGECTOMY Bilateral    TONSILLECTOMY      MEDICATIONS:  amLODipine (NORVASC) 10 MG tablet   cetirizine (ZYRTEC) 10 MG tablet   cholecalciferol (VITAMIN D3) 25 MCG (1000 UNIT) tablet   hydrochlorothiazide (HYDRODIURIL) 25 MG tablet   meloxicam (MOBIC) 15 MG tablet   OVER THE COUNTER MEDICATION   pravastatin (PRAVACHOL) 40 MG tablet   venlafaxine XR (EFFEXOR-XR) 37.5 MG 24 hr capsule   vitamin B-12 (CYANOCOBALAMIN) 500 MCG tablet   No current facility-administered medications for this encounter.    Shonna Chock, PA-C Surgical Short Stay/Anesthesiology [facility] Phone [phone] [facility] Phone [phone] 12/18/2020 5:11 PM

## 2020-12-18 NOTE — Anesthesia Preprocedure Evaluation (Addendum)
Anesthesia Evaluation  Patient identified by MRN, date of birth, ID band Patient awake    Reviewed: Allergy & Precautions, NPO status , Patient's Chart, lab work & pertinent test results  Airway Mallampati: II  TM Distance: >3 FB Neck ROM: Full    Dental  (+) Dental Advisory Given, Edentulous Upper, Edentulous Lower   Pulmonary Current SmokerPatient did not abstain from smoking.,    Pulmonary exam normal breath sounds clear to auscultation       Cardiovascular hypertension, Pt. on medications + Peripheral Vascular Disease  Normal cardiovascular exam Rhythm:Regular Rate:Normal     Neuro/Psych PSYCHIATRIC DISORDERS Anxiety Depression  stenosis L5-S1 left    GI/Hepatic negative GI ROS, Neg liver ROS,   Endo/Other  negative endocrine ROS  Renal/GU negative Renal ROS     Musculoskeletal negative musculoskeletal ROS (+)   Abdominal   Peds  Hematology negative hematology ROS (+)   Anesthesia Other Findings   Reproductive/Obstetrics                           Anesthesia Physical Anesthesia Plan  ASA: 3  Anesthesia Plan: General   Post-op Pain Management:    Induction: Intravenous  PONV Risk Score and Plan: 3 and Midazolam, Dexamethasone and Ondansetron  Airway Management Planned: Oral ETT  Additional Equipment:   Intra-op Plan:   Post-operative Plan: Extubation in OR  Informed Consent: I have reviewed the patients History and Physical, chart, labs and discussed the procedure including the risks, benefits and alternatives for the proposed anesthesia with the patient or authorized representative who has indicated his/her understanding and acceptance.     Dental advisory given  Plan Discussed with: CRNA  Anesthesia Plan Comments: (PAT note written 12/18/2020 by Shonna Chock, PA-C. )      Anesthesia Quick Evaluation

## 2020-12-20 ENCOUNTER — Ambulatory Visit: Payer: Self-pay | Admitting: Orthopedic Surgery

## 2020-12-20 NOTE — H&P (Signed)
Miranda Harvey is an 55 y.o. female.   Chief Complaint: back and left leg pain HPI: Reason for Visit: (normal) visit for: (back) Location (Lower Extremity): leg pain on the left, , ; left buttock Severity: pain level 10/10 Aggravating Factors: standing for ; walking for Associated Symptoms: numbness/tingling (LLE) Medications: The patient is taking Percocet and Mobic Notes: The patient is 9 weeks and 5 days out from L L5-S1 ESI She reports she can no longer tolerate at work. Pain is continuing and severe. The epidural was only temporarily helpful  Past Medical History:  Diagnosis Date   Anxiety    Hypertension     Past Surgical History:  Procedure Laterality Date   BILATERAL SALPINGECTOMY Bilateral    TONSILLECTOMY      Family History  Problem Relation Age of Onset   Diabetes Son    Cancer Maternal Grandfather    Cancer Cousin    Breast cancer Neg Hx    Social History:  reports that she has been smoking cigarettes. She has a 17.00 pack-year smoking history. She has never used smokeless tobacco. She reports that she does not drink alcohol and does not use drugs.  Allergies:  Allergies  Allergen Reactions   Penicillins     C-Diff   Medications: amLODIPine 10 mg tablet ergocalciferol (vitamin D2) 1,250 mcg (50,000 unit) capsule hydroCHLOROthiazide 25 mg tablet meloxicam 15 mg tablet oxyCODONE-acetaminophen 5 mg-325 mg tablet pravastatin 40 mg tablet venlafaxine ER 37.5 mg capsule,extended release 24 hr  Review of Systems  Constitutional: Negative.   HENT: Negative.    Eyes: Negative.   Respiratory: Negative.    Cardiovascular: Negative.   Gastrointestinal: Negative.   Endocrine: Negative.   Genitourinary: Negative.   Musculoskeletal:  Positive for back pain and myalgias.  Skin: Negative.   Neurological:  Positive for weakness and numbness.   There were no vitals taken for this visit. Physical Exam Constitutional:      Appearance: Normal appearance.   HENT:     Head: Normocephalic and atraumatic.     Right Ear: External ear normal.     Left Ear: External ear normal.     Nose: Nose normal.     Mouth/Throat:     Pharynx: Oropharynx is clear.  Eyes:     Conjunctiva/sclera: Conjunctivae normal.  Cardiovascular:     Rate and Rhythm: Normal rate and regular rhythm.     Pulses: Normal pulses.     Heart sounds: Normal heart sounds.  Pulmonary:     Effort: Pulmonary effort is normal.  Abdominal:     General: Bowel sounds are normal.  Musculoskeletal:     Cervical back: Normal range of motion.     Comments: Gait and Station: Appearance: ambulating with no assistive devices and antalgic gait.  Constitutional: General Appearance: healthy-appearing and distress (mild).  Psychiatric: Mood and Affect: active and alert.  Cardiovascular System: Edema Right: none; Dorsalis and posterior tibial pulses 2+. Edema Left: none.  Abdomen: Inspection and Palpation: non-distended and no tenderness.  Skin: Inspection and palpation: no rash.  Lumbar Spine: Inspection: normal alignment. Bony Palpation of the Lumbar Spine: tender at lumbosacral junction.. Bony Palpation of the Right Hip: no tenderness of the greater trochanter and tenderness of the SI joint; Pelvis stable. Bony Palpation of the Left Hip: no tenderness of the greater trochanter and tenderness of the SI joint. Soft Tissue Palpation on the Right: No flank pain with percussion. Active Range of Motion: limited flexion and extention.  Motor Strength: L1   Motor Strength on the Right: hip flexion iliopsoas 5/5. L1 Motor Strength on the Left: hip flexion iliopsoas 5/5. L2-L4 Motor Strength on the Right: knee extension quadriceps 5/5. L2-L4 Motor Strength on the Left: knee extension quadriceps 5/5. L5 Motor Strength on the Right: ankle dorsiflexion tibialis anterior 5/5 and great toe extension extensor hallucis longus 5/5. L5 Motor Strength on the Left: ankle dorsiflexion tibialis anterior 5/5 and great  toe extension extensor hallucis longus 4/5. S1 Motor Strength on the Right: plantar flexion gastrocnemius 4/5. S1 Motor Strength on the Left: plantar flexion gastrocnemius 5/5.  Neurological System: Knee Reflex Right: normal (2). Knee Reflex Left: normal (2). Ankle Reflex Right: normal (2). Ankle Reflex Left: diminished (1). Babinski Reflex Right: plantar reflex absent. Babinski Reflex Left: plantar reflex absent. Sensation on the Right: normal distal extremities. Sensation on the Left: normal distal extremities. Special Tests on the Right: no clonus of the ankle/knee. Special Tests on the Left: no clonus of the ankle/knee and seated straight leg raising test positive.  Skin:    General: Skin is warm and dry.  Neurological:     Mental Status: She is alert.    MRI of her lumbar spine demonstrates a paracentral disc herniation L5-S1 deflecting the S1 nerve root with compression into the lateral recess.  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 This is been refractory to conservative treatment that is included a home exercise program with activity modification. In addition an epidural steroid injection as well as analgesics that have included and required oxycodone and meloxicam.  Plan:  We discussed options living with her symptoms versus lumbar decompression she would like to proceed with the latter.  I had an extensive discussion with the patient concerning the pathology relevant anatomy and treatment options. At this point exhausting conservative treatment and in the presence of a neurologic deficit we discussed microlumbar decompression. I discussed the risks and benefits including bleeding, infection, DVT, PE, anesthetic complications, worsening in their symptoms, improvement in their symptoms, C SF leakage, epidural fibrosis, need for future surgeries such as revision discectomy and  lumbar fusion. I also indicated that this is an operation to basically decompress the nerve root to allow recovery as opposed to fixing a herniated disc and that the incidence of recurrent chest disc herniation can approach 15%. Also that nerve root recovery is variable and may not recover completely.  I discussed the operative course including overnight in the hospital. Immediate ambulation. Follow-up in 2 weeks for suture removal. 6 weeks until healing of the herniation followed by 6 weeks of reconditioning and strengthening of the core musculature. Also discussed the need to employ the concepts of disc pressure management and core motion following the surgery to minimize the risk of recurrent disc herniation. We will obtain preoperative clearance i if necessary and proceed accordingly.   She has an allergy to amoxicillin and she was told she could not take penicillin. We use vancomycin and gentamicin. No history of MRSA no history of DVT. Gave her a note to be out of work if she is no longer tolerating that. She can continue to perform daily ambulation as tolerated. Continue with her home postural modifications. Call if there are any changes.  Plan microlumbar decompression L5-S1 left  Dorothy Spark, PA-C for Dr Shelle Iron 12/20/2020, 2:13 PM

## 2020-12-20 NOTE — H&P (View-Only) (Signed)
Miranda Harvey is an 55 y.o. female.   Chief Complaint: back and left leg pain HPI: Reason for Visit: (normal) visit for: (back) Location (Lower Extremity): leg pain on the left, , ; left buttock Severity: pain level 10/10 Aggravating Factors: standing for ; walking for Associated Symptoms: numbness/tingling (LLE) Medications: The patient is taking Percocet and Mobic Notes: The patient is 9 weeks and 5 days out from L L5-S1 ESI She reports she can no longer tolerate at work. Pain is continuing and severe. The epidural was only temporarily helpful  Past Medical History:  Diagnosis Date   Anxiety    Hypertension     Past Surgical History:  Procedure Laterality Date   BILATERAL SALPINGECTOMY Bilateral    TONSILLECTOMY      Family History  Problem Relation Age of Onset   Diabetes Son    Cancer Maternal Grandfather    Cancer Cousin    Breast cancer Neg Hx    Social History:  reports that she has been smoking cigarettes. She has a 17.00 pack-year smoking history. She has never used smokeless tobacco. She reports that she does not drink alcohol and does not use drugs.  Allergies:  Allergies  Allergen Reactions   Penicillins     C-Diff   Medications: amLODIPine 10 mg tablet ergocalciferol (vitamin D2) 1,250 mcg (50,000 unit) capsule hydroCHLOROthiazide 25 mg tablet meloxicam 15 mg tablet oxyCODONE-acetaminophen 5 mg-325 mg tablet pravastatin 40 mg tablet venlafaxine ER 37.5 mg capsule,extended release 24 hr  Review of Systems  Constitutional: Negative.   HENT: Negative.    Eyes: Negative.   Respiratory: Negative.    Cardiovascular: Negative.   Gastrointestinal: Negative.   Endocrine: Negative.   Genitourinary: Negative.   Musculoskeletal:  Positive for back pain and myalgias.  Skin: Negative.   Neurological:  Positive for weakness and numbness.   There were no vitals taken for this visit. Physical Exam Constitutional:      Appearance: Normal appearance.   HENT:     Head: Normocephalic and atraumatic.     Right Ear: External ear normal.     Left Ear: External ear normal.     Nose: Nose normal.     Mouth/Throat:     Pharynx: Oropharynx is clear.  Eyes:     Conjunctiva/sclera: Conjunctivae normal.  Cardiovascular:     Rate and Rhythm: Normal rate and regular rhythm.     Pulses: Normal pulses.     Heart sounds: Normal heart sounds.  Pulmonary:     Effort: Pulmonary effort is normal.  Abdominal:     General: Bowel sounds are normal.  Musculoskeletal:     Cervical back: Normal range of motion.     Comments: Gait and Station: Appearance: ambulating with no assistive devices and antalgic gait.  Constitutional: General Appearance: healthy-appearing and distress (mild).  Psychiatric: Mood and Affect: active and alert.  Cardiovascular System: Edema Right: none; Dorsalis and posterior tibial pulses 2+. Edema Left: none.  Abdomen: Inspection and Palpation: non-distended and no tenderness.  Skin: Inspection and palpation: no rash.  Lumbar Spine: Inspection: normal alignment. Bony Palpation of the Lumbar Spine: tender at lumbosacral junction.. Bony Palpation of the Right Hip: no tenderness of the greater trochanter and tenderness of the SI joint; Pelvis stable. Bony Palpation of the Left Hip: no tenderness of the greater trochanter and tenderness of the SI joint. Soft Tissue Palpation on the Right: No flank pain with percussion. Active Range of Motion: limited flexion and extention.  Motor Strength: L1  Motor Strength on the Right: hip flexion iliopsoas 5/5. L1 Motor Strength on the Left: hip flexion iliopsoas 5/5. L2-L4 Motor Strength on the Right: knee extension quadriceps 5/5. L2-L4 Motor Strength on the Left: knee extension quadriceps 5/5. L5 Motor Strength on the Right: ankle dorsiflexion tibialis anterior 5/5 and great toe extension extensor hallucis longus 5/5. L5 Motor Strength on the Left: ankle dorsiflexion tibialis anterior 5/5 and great  toe extension extensor hallucis longus 4/5. S1 Motor Strength on the Right: plantar flexion gastrocnemius 4/5. S1 Motor Strength on the Left: plantar flexion gastrocnemius 5/5.  Neurological System: Knee Reflex Right: normal (2). Knee Reflex Left: normal (2). Ankle Reflex Right: normal (2). Ankle Reflex Left: diminished (1). Babinski Reflex Right: plantar reflex absent. Babinski Reflex Left: plantar reflex absent. Sensation on the Right: normal distal extremities. Sensation on the Left: normal distal extremities. Special Tests on the Right: no clonus of the ankle/knee. Special Tests on the Left: no clonus of the ankle/knee and seated straight leg raising test positive.  Skin:    General: Skin is warm and dry.  Neurological:     Mental Status: She is alert.    MRI of her lumbar spine demonstrates a paracentral disc herniation L5-S1 deflecting the S1 nerve root with compression into the lateral recess.  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 This is been refractory to conservative treatment that is included a home exercise program with activity modification. In addition an epidural steroid injection as well as analgesics that have included and required oxycodone and meloxicam.  Plan:  We discussed options living with her symptoms versus lumbar decompression she would like to proceed with the latter.  I had an extensive discussion with the patient concerning the pathology relevant anatomy and treatment options. At this point exhausting conservative treatment and in the presence of a neurologic deficit we discussed microlumbar decompression. I discussed the risks and benefits including bleeding, infection, DVT, PE, anesthetic complications, worsening in their symptoms, improvement in their symptoms, C SF leakage, epidural fibrosis, need for future surgeries such as revision discectomy and  lumbar fusion. I also indicated that this is an operation to basically decompress the nerve root to allow recovery as opposed to fixing a herniated disc and that the incidence of recurrent chest disc herniation can approach 15%. Also that nerve root recovery is variable and may not recover completely.  I discussed the operative course including overnight in the hospital. Immediate ambulation. Follow-up in 2 weeks for suture removal. 6 weeks until healing of the herniation followed by 6 weeks of reconditioning and strengthening of the core musculature. Also discussed the need to employ the concepts of disc pressure management and core motion following the surgery to minimize the risk of recurrent disc herniation. We will obtain preoperative clearance i if necessary and proceed accordingly.   She has an allergy to amoxicillin and she was told she could not take penicillin. We use vancomycin and gentamicin. No history of MRSA no history of DVT. Gave her a note to be out of work if she is no longer tolerating that. She can continue to perform daily ambulation as tolerated. Continue with her home postural modifications. Call if there are any changes.  Plan microlumbar decompression L5-S1 left  Dorothy Spark, PA-C for Dr Shelle Iron 12/20/2020, 2:13 PM

## 2020-12-21 ENCOUNTER — Ambulatory Visit (HOSPITAL_COMMUNITY): Payer: BC Managed Care – PPO | Admitting: Physician Assistant

## 2020-12-21 ENCOUNTER — Ambulatory Visit (HOSPITAL_COMMUNITY): Payer: BC Managed Care – PPO

## 2020-12-21 ENCOUNTER — Other Ambulatory Visit: Payer: Self-pay

## 2020-12-21 ENCOUNTER — Ambulatory Visit (HOSPITAL_COMMUNITY)
Admission: RE | Admit: 2020-12-21 | Discharge: 2020-12-21 | Disposition: A | Discharge status: Home / Self Care | Payer: BC Managed Care – PPO | Attending: Specialist | Admitting: Specialist

## 2020-12-21 ENCOUNTER — Encounter (HOSPITAL_COMMUNITY): Payer: Self-pay | Admitting: Specialist

## 2020-12-21 ENCOUNTER — Encounter (HOSPITAL_COMMUNITY)
Admission: RE | Disposition: A | Discharge status: Home / Self Care | Payer: Self-pay | Source: Home / Self Care | Attending: Specialist

## 2020-12-21 DIAGNOSIS — Z79891 Long term (current) use of opiate analgesic: Secondary | ICD-10-CM | POA: Diagnosis not present

## 2020-12-21 DIAGNOSIS — Z791 Long term (current) use of non-steroidal anti-inflammatories (NSAID): Secondary | ICD-10-CM | POA: Diagnosis not present

## 2020-12-21 DIAGNOSIS — M5117 Intervertebral disc disorders with radiculopathy, lumbosacral region: Secondary | ICD-10-CM | POA: Insufficient documentation

## 2020-12-21 DIAGNOSIS — M5126 Other intervertebral disc displacement, lumbar region: Secondary | ICD-10-CM | POA: Diagnosis present

## 2020-12-21 DIAGNOSIS — M4807 Spinal stenosis, lumbosacral region: Secondary | ICD-10-CM | POA: Insufficient documentation

## 2020-12-21 DIAGNOSIS — Z880 Allergy status to penicillin: Secondary | ICD-10-CM | POA: Insufficient documentation

## 2020-12-21 DIAGNOSIS — Z79899 Other long term (current) drug therapy: Secondary | ICD-10-CM | POA: Diagnosis not present

## 2020-12-21 DIAGNOSIS — F17210 Nicotine dependence, cigarettes, uncomplicated: Secondary | ICD-10-CM | POA: Insufficient documentation

## 2020-12-21 DIAGNOSIS — Z419 Encounter for procedure for purposes other than remedying health state, unspecified: Secondary | ICD-10-CM

## 2020-12-21 HISTORY — PX: LUMBAR LAMINECTOMY/DECOMPRESSION MICRODISCECTOMY: SHX5026

## 2020-12-21 SURGERY — LUMBAR LAMINECTOMY/DECOMPRESSION MICRODISCECTOMY
Anesthesia: General | Laterality: Left

## 2020-12-21 MED ORDER — THROMBIN 20000 UNITS EX SOLR
CUTANEOUS | Status: AC
  Filled 2020-12-21: qty 20000

## 2020-12-21 MED ORDER — LIDOCAINE 2% (20 MG/ML) 5 ML SYRINGE
INTRAMUSCULAR | Status: DC | PRN
Start: 2020-12-21 — End: 2020-12-21
  Administered 2020-12-21: 80 mg via INTRAVENOUS

## 2020-12-21 MED ORDER — NEOSTIGMINE METHYLSULFATE 3 MG/3ML IV SOSY
PREFILLED_SYRINGE | INTRAVENOUS | Status: AC
  Filled 2020-12-21: qty 3

## 2020-12-21 MED ORDER — MIDAZOLAM HCL 2 MG/2ML IJ SOLN
INTRAMUSCULAR | Status: AC
  Filled 2020-12-21: qty 2

## 2020-12-21 MED ORDER — VANCOMYCIN HCL IN DEXTROSE 1-5 GM/200ML-% IV SOLN: 1000.0000 mg | Freq: Once | INTRAVENOUS | Status: DC

## 2020-12-21 MED ORDER — 0.9 % SODIUM CHLORIDE (POUR BTL) OPTIME
TOPICAL | Status: DC | PRN
  Administered 2020-12-21: 1000 mL

## 2020-12-21 MED ORDER — RISAQUAD PO CAPS
1.0000 | ORAL_CAPSULE | Freq: Every day | ORAL | Status: DC
  Filled 2020-12-21: qty 1

## 2020-12-21 MED ORDER — BUPIVACAINE-EPINEPHRINE 0.5% -1:200000 IJ SOLN
INTRAMUSCULAR | Status: DC | PRN
  Administered 2020-12-21: 6 mL

## 2020-12-21 MED ORDER — MENTHOL 3 MG MT LOZG: 1.0000 | LOZENGE | OROMUCOSAL | Status: DC | PRN

## 2020-12-21 MED ORDER — GENTAMICIN SULFATE 40 MG/ML IJ SOLN
1.5000 mg/kg | INTRAVENOUS | Status: AC
  Administered 2020-12-21: 130 mg via INTRAVENOUS
  Filled 2020-12-21: qty 3.25

## 2020-12-21 MED ORDER — POLYETHYLENE GLYCOL 3350 17 G PO PACK: 17.0000 g | PACK | Freq: Every day | ORAL | 0 refills | Status: DC

## 2020-12-21 MED ORDER — EPHEDRINE SULFATE-NACL 50-0.9 MG/10ML-% IV SOSY
PREFILLED_SYRINGE | INTRAVENOUS | Status: DC | PRN
  Administered 2020-12-21 (×2): 5 mg via INTRAVENOUS
  Administered 2020-12-21: 10 mg via INTRAVENOUS

## 2020-12-21 MED ORDER — METHOCARBAMOL 1000 MG/10ML IJ SOLN
500.0000 mg | Freq: Four times a day (QID) | INTRAVENOUS | Status: DC | PRN
  Filled 2020-12-21: qty 5

## 2020-12-21 MED ORDER — OXYCODONE HCL 5 MG PO TABS: 5.0000 mg | ORAL_TABLET | ORAL | 0 refills | Status: DC | PRN

## 2020-12-21 MED ORDER — CHLORHEXIDINE GLUCONATE 0.12 % MT SOLN
15.0000 mL | Freq: Once | OROMUCOSAL | Status: AC
  Administered 2020-12-21: 15 mL via OROMUCOSAL
  Filled 2020-12-21: qty 15

## 2020-12-21 MED ORDER — KCL IN DEXTROSE-NACL 20-5-0.45 MEQ/L-%-% IV SOLN: INTRAVENOUS | Status: DC

## 2020-12-21 MED ORDER — LORATADINE 10 MG PO TABS
10.0000 mg | ORAL_TABLET | Freq: Every day | ORAL | Status: DC
Start: 2020-12-22 — End: 2020-12-21

## 2020-12-21 MED ORDER — PROMETHAZINE HCL 25 MG/ML IJ SOLN: 6.2500 mg | INTRAMUSCULAR | Status: DC | PRN

## 2020-12-21 MED ORDER — DOCUSATE SODIUM 100 MG PO CAPS: 100.0000 mg | ORAL_CAPSULE | Freq: Two times a day (BID) | ORAL | Status: DC

## 2020-12-21 MED ORDER — FENTANYL CITRATE (PF) 250 MCG/5ML IJ SOLN
INTRAMUSCULAR | Status: AC
  Filled 2020-12-21: qty 5

## 2020-12-21 MED ORDER — ORAL CARE MOUTH RINSE: 15.0000 mL | Freq: Once | OROMUCOSAL | Status: AC

## 2020-12-21 MED ORDER — PROPOFOL 10 MG/ML IV BOLUS
INTRAVENOUS | Status: AC
  Filled 2020-12-21: qty 20

## 2020-12-21 MED ORDER — PROPOFOL 10 MG/ML IV BOLUS
INTRAVENOUS | Status: DC | PRN
  Administered 2020-12-21: 150 mg via INTRAVENOUS

## 2020-12-21 MED ORDER — ONDANSETRON HCL 4 MG PO TABS: 4.0000 mg | ORAL_TABLET | Freq: Four times a day (QID) | ORAL | Status: DC | PRN

## 2020-12-21 MED ORDER — DEXMEDETOMIDINE (PRECEDEX) IN NS 20 MCG/5ML (4 MCG/ML) IV SYRINGE
PREFILLED_SYRINGE | INTRAVENOUS | Status: DC | PRN
  Administered 2020-12-21: 8 ug via INTRAVENOUS

## 2020-12-21 MED ORDER — ONDANSETRON HCL 4 MG/2ML IJ SOLN
INTRAMUSCULAR | Status: AC
  Filled 2020-12-21: qty 2

## 2020-12-21 MED ORDER — ACETAMINOPHEN 10 MG/ML IV SOLN
INTRAVENOUS | Status: AC
  Filled 2020-12-21: qty 100

## 2020-12-21 MED ORDER — ROCURONIUM BROMIDE 10 MG/ML (PF) SYRINGE
PREFILLED_SYRINGE | INTRAVENOUS | Status: AC
  Filled 2020-12-21: qty 10

## 2020-12-21 MED ORDER — ONDANSETRON HCL 4 MG/2ML IJ SOLN: 4.0000 mg | Freq: Four times a day (QID) | INTRAMUSCULAR | Status: DC | PRN

## 2020-12-21 MED ORDER — BISACODYL 5 MG PO TBEC: 5.0000 mg | DELAYED_RELEASE_TABLET | Freq: Every day | ORAL | Status: DC | PRN

## 2020-12-21 MED ORDER — ACETAMINOPHEN 325 MG PO TABS: 650.0000 mg | ORAL_TABLET | ORAL | Status: DC | PRN

## 2020-12-21 MED ORDER — DOCUSATE SODIUM 100 MG PO CAPS: 100.0000 mg | ORAL_CAPSULE | Freq: Two times a day (BID) | ORAL | 1 refills | Status: DC | PRN

## 2020-12-21 MED ORDER — BUPIVACAINE-EPINEPHRINE 0.5% -1:200000 IJ SOLN
INTRAMUSCULAR | Status: AC
  Filled 2020-12-21: qty 1

## 2020-12-21 MED ORDER — MIDAZOLAM HCL 5 MG/5ML IJ SOLN
INTRAMUSCULAR | Status: DC | PRN
  Administered 2020-12-21: 2 mg via INTRAVENOUS

## 2020-12-21 MED ORDER — VENLAFAXINE HCL ER 75 MG PO CP24: 75.0000 mg | ORAL_CAPSULE | Freq: Every day | ORAL | Status: DC

## 2020-12-21 MED ORDER — THROMBIN 20000 UNITS EX SOLR
CUTANEOUS | Status: DC | PRN
  Administered 2020-12-21: 20 mL via TOPICAL

## 2020-12-21 MED ORDER — AMLODIPINE BESYLATE 5 MG PO TABS: 10.0000 mg | ORAL_TABLET | Freq: Every day | ORAL | Status: DC

## 2020-12-21 MED ORDER — METHOCARBAMOL 500 MG PO TABS: 500.0000 mg | ORAL_TABLET | Freq: Four times a day (QID) | ORAL | Status: DC | PRN

## 2020-12-21 MED ORDER — ONDANSETRON HCL 4 MG/2ML IJ SOLN
INTRAMUSCULAR | Status: DC | PRN
  Administered 2020-12-21: 4 mg via INTRAVENOUS

## 2020-12-21 MED ORDER — ACETAMINOPHEN 650 MG RE SUPP: 650.0000 mg | RECTAL | Status: DC | PRN

## 2020-12-21 MED ORDER — SUGAMMADEX SODIUM 200 MG/2ML IV SOLN
INTRAVENOUS | Status: DC | PRN
  Administered 2020-12-21: 160 mg via INTRAVENOUS

## 2020-12-21 MED ORDER — SUCCINYLCHOLINE CHLORIDE 200 MG/10ML IV SOSY
PREFILLED_SYRINGE | INTRAVENOUS | Status: AC
  Filled 2020-12-21: qty 10

## 2020-12-21 MED ORDER — LACTATED RINGERS IV SOLN: INTRAVENOUS | Status: DC

## 2020-12-21 MED ORDER — OXYCODONE HCL 5 MG PO TABS
10.0000 mg | ORAL_TABLET | ORAL | Status: DC | PRN
Start: 2020-12-21 — End: 2020-12-21

## 2020-12-21 MED ORDER — ROCURONIUM BROMIDE 100 MG/10ML IV SOLN
INTRAVENOUS | Status: DC | PRN
Start: 2020-12-21 — End: 2020-12-21
  Administered 2020-12-21: 60 mg via INTRAVENOUS
  Administered 2020-12-21: 20 mg via INTRAVENOUS

## 2020-12-21 MED ORDER — PHENYLEPHRINE HCL-NACL 20-0.9 MG/250ML-% IV SOLN
INTRAVENOUS | Status: DC | PRN
  Administered 2020-12-21: 30 ug/min via INTRAVENOUS

## 2020-12-21 MED ORDER — ALUM & MAG HYDROXIDE-SIMETH 200-200-20 MG/5ML PO SUSP: 30.0000 mL | Freq: Four times a day (QID) | ORAL | Status: DC | PRN

## 2020-12-21 MED ORDER — OXYCODONE HCL 5 MG PO TABS: 5.0000 mg | ORAL_TABLET | ORAL | Status: DC | PRN

## 2020-12-21 MED ORDER — FENTANYL CITRATE (PF) 100 MCG/2ML IJ SOLN: 25.0000 ug | INTRAMUSCULAR | Status: DC | PRN

## 2020-12-21 MED ORDER — VITAMIN D 25 MCG (1000 UNIT) PO TABS: 1000.0000 [IU] | ORAL_TABLET | Freq: Every day | ORAL | Status: DC

## 2020-12-21 MED ORDER — METHOCARBAMOL 500 MG PO TABS
500.0000 mg | ORAL_TABLET | Freq: Three times a day (TID) | ORAL | 1 refills | Status: DC | PRN
Start: 2020-12-21 — End: 2023-12-02

## 2020-12-21 MED ORDER — ACETAMINOPHEN 10 MG/ML IV SOLN
1000.0000 mg | INTRAVENOUS | Status: AC
  Administered 2020-12-21: 1000 mg via INTRAVENOUS
  Filled 2020-12-21: qty 100

## 2020-12-21 MED ORDER — PHENOL 1.4 % MT LIQD: 1.0000 | OROMUCOSAL | Status: DC | PRN

## 2020-12-21 MED ORDER — DEXAMETHASONE SODIUM PHOSPHATE 10 MG/ML IJ SOLN
INTRAMUSCULAR | Status: DC | PRN
  Administered 2020-12-21: 10 mg via INTRAVENOUS

## 2020-12-21 MED ORDER — VITAMIN B-12 1000 MCG PO TABS: 500.0000 ug | ORAL_TABLET | Freq: Every day | ORAL | Status: DC

## 2020-12-21 MED ORDER — DEXAMETHASONE SODIUM PHOSPHATE 10 MG/ML IJ SOLN
INTRAMUSCULAR | Status: AC
  Filled 2020-12-21: qty 1

## 2020-12-21 MED ORDER — VANCOMYCIN HCL IN DEXTROSE 1-5 GM/200ML-% IV SOLN
1000.0000 mg | INTRAVENOUS | Status: AC
  Administered 2020-12-21: 1000 mg via INTRAVENOUS
  Filled 2020-12-21: qty 200

## 2020-12-21 MED ORDER — POLYETHYLENE GLYCOL 3350 17 G PO PACK: 17.0000 g | PACK | Freq: Every day | ORAL | Status: DC | PRN

## 2020-12-21 MED ORDER — FENTANYL CITRATE (PF) 100 MCG/2ML IJ SOLN
INTRAMUSCULAR | Status: DC | PRN
  Administered 2020-12-21: 50 ug via INTRAVENOUS
  Administered 2020-12-21: 100 ug via INTRAVENOUS
  Administered 2020-12-21: 25 ug via INTRAVENOUS
  Administered 2020-12-21: 75 ug via INTRAVENOUS

## 2020-12-21 SURGICAL SUPPLY — 57 items
BAG COUNTER SPONGE SURGICOUNT (BAG) ×4 IMPLANT
BAG DECANTER FOR FLEXI CONT (MISCELLANEOUS) ×3 IMPLANT
BAG SURGICOUNT SPONGE COUNTING (BAG) ×2
BAND RUBBER #18 3X1/16 STRL (MISCELLANEOUS) ×6 IMPLANT
BUR RND DIAMOND ELITE 4.0 (BURR) ×2 IMPLANT
BUR RND DIAMOND ELITE 4.0MM (BURR) ×1
BUR STRYKR EGG 5.0 (BURR) IMPLANT
CLEANER TIP ELECTROSURG 2X2 (MISCELLANEOUS) ×3 IMPLANT
CLOSURE WOUND 1/2 X4 (GAUZE/BANDAGES/DRESSINGS) ×1
CNTNR URN SCR LID CUP LEK RST (MISCELLANEOUS) ×1 IMPLANT
CONT SPEC 4OZ STRL OR WHT (MISCELLANEOUS) ×3
DRAPE LAPAROTOMY 100X72X124 (DRAPES) ×3 IMPLANT
DRAPE MICROSCOPE LEICA (MISCELLANEOUS) ×3 IMPLANT
DRAPE SHEET LG 3/4 BI-LAMINATE (DRAPES) ×3 IMPLANT
DRAPE SURG 17X11 SM STRL (DRAPES) ×3 IMPLANT
DRAPE UTILITY XL STRL (DRAPES) ×3 IMPLANT
DRSG AQUACEL AG ADV 3.5X 4 (GAUZE/BANDAGES/DRESSINGS) ×3 IMPLANT
DRSG AQUACEL AG ADV 3.5X 6 (GAUZE/BANDAGES/DRESSINGS) IMPLANT
DRSG TELFA 3X8 NADH (GAUZE/BANDAGES/DRESSINGS) IMPLANT
DURAPREP 26ML APPLICATOR (WOUND CARE) ×3 IMPLANT
DURASEAL SPINE SEALANT 3ML (MISCELLANEOUS) IMPLANT
ELECT BLADE 4.0 EZ CLEAN MEGAD (MISCELLANEOUS) ×3
ELECT REM PT RETURN 9FT ADLT (ELECTROSURGICAL) ×3
ELECTRODE BLDE 4.0 EZ CLN MEGD (MISCELLANEOUS) ×1 IMPLANT
ELECTRODE REM PT RTRN 9FT ADLT (ELECTROSURGICAL) ×1 IMPLANT
GLOVE SURG POLYISO LF SZ7.5 (GLOVE) ×3 IMPLANT
GLOVE SURG POLYISO LF SZ8 (GLOVE) ×6 IMPLANT
GLOVE SURG UNDER POLY LF SZ6.5 (GLOVE) ×3 IMPLANT
GLOVE SURG UNDER POLY LF SZ7 (GLOVE) ×12 IMPLANT
GLOVE SURG UNDER POLY LF SZ7.5 (GLOVE) ×12 IMPLANT
GOWN STRL REUS W/ TWL LRG LVL3 (GOWN DISPOSABLE) ×2 IMPLANT
GOWN STRL REUS W/ TWL XL LVL3 (GOWN DISPOSABLE) ×1 IMPLANT
GOWN STRL REUS W/TWL LRG LVL3 (GOWN DISPOSABLE) ×6
GOWN STRL REUS W/TWL XL LVL3 (GOWN DISPOSABLE) ×3
IV CATH 14GX2 1/4 (CATHETERS) ×3 IMPLANT
KIT BASIN OR (CUSTOM PROCEDURE TRAY) ×3 IMPLANT
NEEDLE 22X1 1/2 (OR ONLY) (NEEDLE) ×3 IMPLANT
NEEDLE SPNL 18GX3.5 QUINCKE PK (NEEDLE) ×6 IMPLANT
PACK LAMINECTOMY NEURO (CUSTOM PROCEDURE TRAY) ×3 IMPLANT
PATTIES SURGICAL .75X.75 (GAUZE/BANDAGES/DRESSINGS) ×3 IMPLANT
SPONGE SURGIFOAM ABS GEL 100 (HEMOSTASIS) ×3 IMPLANT
SPONGE T-LAP 4X18 ~~LOC~~+RFID (SPONGE) ×6 IMPLANT
STAPLER VISISTAT (STAPLE) IMPLANT
STRIP CLOSURE SKIN 1/2X4 (GAUZE/BANDAGES/DRESSINGS) ×2 IMPLANT
SUT NURALON 4 0 TR CR/8 (SUTURE) IMPLANT
SUT PROLENE 3 0 PS 2 (SUTURE) ×3 IMPLANT
SUT VIC AB 1 CT1 27 (SUTURE) ×3
SUT VIC AB 1 CT1 27XBRD ANTBC (SUTURE) ×1 IMPLANT
SUT VIC AB 1-0 CT2 27 (SUTURE) ×3 IMPLANT
SUT VIC AB 2-0 CT1 27 (SUTURE) ×3
SUT VIC AB 2-0 CT1 TAPERPNT 27 (SUTURE) ×1 IMPLANT
SUT VIC AB 2-0 CT2 27 (SUTURE) IMPLANT
SYR 3ML LL SCALE MARK (SYRINGE) ×3 IMPLANT
TOWEL GREEN STERILE (TOWEL DISPOSABLE) ×3 IMPLANT
TOWEL GREEN STERILE FF (TOWEL DISPOSABLE) ×3 IMPLANT
TRAY FOLEY MTR SLVR 16FR STAT (SET/KITS/TRAYS/PACK) ×3 IMPLANT
YANKAUER SUCT BULB TIP NO VENT (SUCTIONS) ×3 IMPLANT

## 2020-12-21 NOTE — Progress Notes (Signed)
Pharmacy Antibiotic Note  Miranda Harvey is a 55 y.o. female admitted on 12/21/2020 for spinal procedure.  Pharmacy has been consulted for vancomycin dosing for surgical prophylaxis.  Received pre-op vanc 1gm IV at 0620.  Patient does not have a drain.  SCr 0.95, CrCL 75 ml/min  Plan: Vanc 1gm IV x 1 this evening Pharmacy will sign off  Height: 5\' 6"  (167.6 cm) Weight: 83.5 kg (184 lb) IBW/kg (Calculated) : 59.3  Temp (24hrs), Avg:97.9 F (36.6 C), Min:97.6 F (36.4 C), Max:98.2 F (36.8 C)  Recent Labs  Lab 12/15/20 0911  WBC 11.1*  CREATININE 0.95    Estimated Creatinine Clearance: 74.6 mL/min (by C-G formula based on SCr of 0.95 mg/dL).    Allergies  Allergen Reactions   Penicillins     C-Diff    Nicolet Griffy D. 12/17/20, PharmD, BCPS, BCCCP 12/21/2020, 12:50 PM

## 2020-12-21 NOTE — Anesthesia Procedure Notes (Signed)
Procedure Name: Intubation Date/Time: 12/21/2020 7:57 AM Performed by: Cy Blamer, CRNA Pre-anesthesia Checklist: Patient identified, Emergency Drugs available, Suction available and Patient being monitored Patient Re-evaluated:Patient Re-evaluated prior to induction Oxygen Delivery Method: Circle system utilized Preoxygenation: Pre-oxygenation with 100% oxygen Induction Type: IV induction Ventilation: Mask ventilation without difficulty Laryngoscope Size: Miller and 2 Grade View: Grade I Tube type: Oral Tube size: 7.0 mm Number of attempts: 1 Airway Equipment and Method: Stylet and Oral airway Placement Confirmation: ETT inserted through vocal cords under direct vision, positive ETCO2 and breath sounds checked- equal and bilateral Secured at: 21 cm Tube secured with: Tape Dental Injury: Teeth and Oropharynx as per pre-operative assessment

## 2020-12-21 NOTE — Progress Notes (Signed)
PT Cancellation Note  Patient Details Name: Miranda Harvey MRN: [id number] DOB: December 07, 1965   Cancelled Treatment:    Reason Eval/Treat Not Completed: PT screened, no needs identified, will sign off Per OT, no skilled PT needs at this time. Please re-consult if needs change.   Farley Ly, PT, DPT  Acute Rehabilitation Services  Pager: [phone] Office: [phone]    Lehman Prom 12/21/2020, 1:44 PM

## 2020-12-21 NOTE — Transfer of Care (Signed)
Immediate Anesthesia Transfer of Care Note  Patient: Miranda Harvey  Procedure(s) Performed: Microlumbar decompression Lumbar five sacral one  left (Left)  Patient Location: PACU  Anesthesia Type:General  Level of Consciousness: awake, oriented and patient cooperative  Airway & Oxygen Therapy: Patient Spontanous Breathing and Patient connected to face mask oxygen  Post-op Assessment: Report given to RN, Post -op Vital signs reviewed and stable, Patient moving all extremities X 4 and Patient able to stick tongue midline  Post vital signs: stable  Last Vitals:  Vitals Value Taken Time  BP 100/70 12/21/20 1009  Temp 36.5 C 12/21/20 1009  Pulse 73 12/21/20 1014  Resp 12 12/21/20 1014  SpO2 99 % 12/21/20 1014  Vitals shown include unvalidated device data.  Last Pain:  Vitals:   12/21/20 1009  TempSrc:   PainSc: Asleep      Patients Stated Pain Goal: 2 (12/21/20 5465)  Complications: No notable events documented.

## 2020-12-21 NOTE — Anesthesia Postprocedure Evaluation (Signed)
Anesthesia Post Note  Patient: Miranda Harvey  Procedure(s) Performed: Microlumbar decompression Lumbar five sacral one  left (Left)     Patient location during evaluation: PACU Anesthesia Type: General Level of consciousness: awake and alert, awake and oriented Pain management: pain level controlled Vital Signs Assessment: post-procedure vital signs reviewed and stable Respiratory status: spontaneous breathing, nonlabored ventilation and respiratory function stable Cardiovascular status: blood pressure returned to baseline and stable Postop Assessment: no apparent nausea or vomiting Anesthetic complications: no   No notable events documented.  Last Vitals:  Vitals:   12/21/20 1052 12/21/20 1133  BP: 102/61 103/65  Pulse: 75 64  Resp: 20 20  Temp: 36.4 C 36.8 C  SpO2: 93% 92%    Last Pain:  Vitals:   12/21/20 1133  TempSrc: Oral  PainSc:                  Cecile Hearing

## 2020-12-21 NOTE — Brief Op Note (Signed)
12/21/2020  9:59 AM  PATIENT:  Miranda Harvey  55 y.o. female  PRE-OPERATIVE DIAGNOSIS:  stenosis Lumbar five sacral one left  POST-OPERATIVE DIAGNOSIS:  stenosis Lumbar five sacral one left  PROCEDURE:  Procedure(s): Microlumbar decompression Lumbar five sacral one  left (Left)  SURGEON:  Surgeon(s) and Role:    Jene Every, MD - Primary  PHYSICIAN ASSISTANT:   ASSISTANTS: Bissell   ANESTHESIA:   general  EBL:  100 mL   BLOOD ADMINISTERED:none  DRAINS: none   LOCAL MEDICATIONS USED:  MARCAINE     SPECIMEN:    DISPOSITION OF SPECIMEN:  N/A  COUNTS:  YES  TOURNIQUET:  * No tourniquets in log *  DICTATION: .Other Dictation: Dictation Number [id number]  PLAN OF CARE: Admit for overnight observation  PATIENT DISPOSITION:  PACU - hemodynamically stable.   Delay start of Pharmacological VTE agent (>24hrs) due to surgical blood loss or risk of bleeding: no

## 2020-12-21 NOTE — Interval H&P Note (Signed)
History and Physical Interval Note:  12/21/2020 7:15 AM  Miranda Harvey  has presented today for surgery, with the diagnosis of stenosis L5-S1 left.  The various methods of treatment have been discussed with the patient and family. After consideration of risks, benefits and other options for treatment, the patient has consented to  Procedure(s): Microlumbar decompression L5-S1 left (Left) as a surgical intervention.  The patient's history has been reviewed, patient examined, no change in status, stable for surgery.  I have reviewed the patient's chart and labs.  Questions were answered to the patient's satisfaction.     Javier Docker

## 2020-12-21 NOTE — Discharge Instructions (Signed)

## 2020-12-21 NOTE — Evaluation (Signed)
Occupational Therapy Evaluation Patient Details Name: Miranda Harvey MRN: [id number] DOB: 10-11-1965 Today's Date: 12/21/2020    History of Present Illness 55 yo F s/p Microdecompression, L5-S1.  PMH includes anxiety and HTN.   Clinical Impression   Patient admitted for the procedure above.  She endorses a resolution of her pain, and believes she is walking better than before.  Mild operative site discomfort with bed mobility, but otherwise is at, or near, her baseline for mobility and ADL completion.  No further rehab needs in the acute setting.  Back precautions reviewed, and all questions answered.  PT advised no acute physical therapy needs.  She is walking the halls and able to ascend stairs without incident.       Follow Up Recommendations  No OT follow up    Equipment Recommendations  None recommended by OT    Recommendations for Other Services       Precautions / Restrictions Precautions Precautions: Back Precaution Booklet Issued: Yes (comment) Precaution Comments: Reviewed Restrictions Weight Bearing Restrictions: No Other Position/Activity Restrictions: No brace needed      Mobility Bed Mobility Overal bed mobility: Modified Independent               Patient Response: Cooperative  Transfers Overall transfer level: Modified independent                    Balance Overall balance assessment: No apparent balance deficits (not formally assessed)                                         ADL either performed or assessed with clinical judgement   ADL Overall ADL's : Modified independent                                             Vision Baseline Vision/History: 0 No visual deficits Patient Visual Report: No change from baseline       Perception     Praxis      Pertinent Vitals/Pain Pain Assessment: Faces Faces Pain Scale: Hurts a little bit Pain Descriptors / Indicators: Operative site guarding Pain  Intervention(s): Monitored during session     Hand Dominance Right   Extremity/Trunk Assessment Upper Extremity Assessment Upper Extremity Assessment: Overall WFL for tasks assessed   Lower Extremity Assessment Lower Extremity Assessment: Overall WFL for tasks assessed   Cervical / Trunk Assessment Cervical / Trunk Assessment: Normal   Communication Communication Communication: No difficulties   Cognition Arousal/Alertness: Awake/alert Behavior During Therapy: WFL for tasks assessed/performed Overall Cognitive Status: Within Functional Limits for tasks assessed                                                      Home Living Family/patient expects to be discharged to:: Private residence Living Arrangements: Spouse/significant other;Children Available Help at Discharge: Family;Available 24 hours/day Type of Home: House Home Access: Stairs to enter [employer] of Steps: 3   Home Layout: One level     Bathroom Shower/Tub: Producer, television/film/video: Standard Bathroom Accessibility: Yes How Accessible: Accessible via walker Home Equipment: Shower seat -  built in          Prior Functioning/Environment Level of Independence: Independent        Comments: continues to drive and works FT        OT Problem List: Impaired balance (sitting and/or standing)      OT Treatment/Interventions:      OT Goals(Current goals can be found in the care plan section) Acute Rehab OT Goals Patient Stated Goal: Return home OT Goal Formulation: With patient Time For Goal Achievement: 12/21/20 Potential to Achieve Goals: Good  OT Frequency:     Barriers to D/C:  None noted          Co-evaluation              AM-PAC OT "6 Clicks" Daily Activity     Outcome Measure Help from another person eating meals?: None Help from another person taking care of personal grooming?: None Help from another person toileting, which includes  using toliet, bedpan, or urinal?: None Help from another person bathing (including washing, rinsing, drying)?: None Help from another person to put on and taking off regular upper body clothing?: None Help from another person to put on and taking off regular lower body clothing?: None 6 Click Score: 24   End of Session Nurse Communication: Mobility status  Activity Tolerance: Patient tolerated treatment well Patient left: in bed;with call bell/phone within reach;with family/visitor present  OT Visit Diagnosis: Unsteadiness on feet (R26.81)                Time: 9163-8466 OT Time Calculation (min): 27 min Charges:  OT General Charges $OT Visit: 1 Visit OT Evaluation $OT Eval Moderate Complexity: 1 Mod OT Treatments $Self Care/Home Management : 8-22 mins  12/21/2020  RP, OTR/L  Acute Rehabilitation Services  Office:  [phone]   Miranda Harvey 12/21/2020, 2:04 PM

## 2020-12-21 NOTE — Op Note (Signed)
NAMEBLEU, Miranda Harvey MEDICAL RECORD NO: [id number] ACCOUNT NO: [id number] DATE OF BIRTH: 12/26/1965 FACILITY: [facility] LOCATION: [facility] PHYSICIAN: Javier Docker, MD  Operative Report   DATE OF PROCEDURE: 12/21/2020  PREOPERATIVE DIAGNOSES:  Spinal stenosis, herniated nucleus pulposus, L5-S1, left.  POSTOPERATIVE DIAGNOSES:  Spinal stenosis, herniated nucleus pulposus, L5-S1, left.  PROCEDURE PERFORMED: 1.  Microdecompression, L5-S1, left. 2.  Foraminotomies, L5-S1, left.  Technical difficulty increased due to the patient's sclerotic bone and transitional segment at L5-S1.  ASSISTANT:  Andrez Grime, PA  HISTORY:  A 55 year old with left lower extremity radicular pain, L5-S1 nerve root distribution, secondary to HNP, lateral recess stenosis and compression of the S1 nerve root.  She had a transitional segment at L5-S1.  Indicated for microdecompression  and diskectomy.  Risks and benefits were discussed including bleeding, infection, damage to neurovascular structures, no change in symptoms, worsening symptoms, DVT, PE, anesthetic complications, etc.  TECHNIQUE:  The patient in supine position.  After induction of adequate general anesthesia, Kefzol and gentamicin, she was placed prone on the Wilson frame.  All bony prominences were well padded.  Lumbar region was prepped and draped in the usual  sterile fashion.  Two 18-gauge spinal needles were utilized to localize the L5-S1 interspace, confirmed with x-ray.  Incision was made from the spinous process of L5-S1.  Subcutaneous tissue was dissected, electrocautery was utilized to achieve  hemostasis.  The patient had ample subcutaneous adipose layer.  Dorsolumbar fascia was divided in line with skin incision.  Paraspinous muscle elevated from the lamina 5 and 1.  McCulloch retractor was placed.  Operating microscope was draped and brought  in the surgical field, with markers at the L5-S1 space and at the transitional space.  The patient  had a very small interlaminar window due to facet hypertrophy on the left.  I used a high-speed diamond bur to remove a portion of the inferior  articulating process of L5, identifying the hypertrophied superior articulating process of S1.  I then used a micro curette to remove a portion of the superior articulating process of S1.  I then placed a Penfield between the nerve root in the lateral  recess.  I then decompressed the lateral recess to the medial border of the pedicle with 2 mm Kerrison and performed a generous foraminotomy of S1.  There was a significant epidural venous plexus encountered.  Electrocautery was utilized to achieve  hemostasis, followed by ligating of these epidural veins.  Following this and the foraminotomy of  L5, decompressed L5 foramen as she had some foraminal stenosis there.  I identified the S1 nerve root, mobilized it medially as it was identified  previously.  Held it with a D'Errico, cauterized venous plexus over the disk space.  HNP was noted.  I performed the annulotomy.  Copious portion of disk material was removed from the disk space with a micropituitary, further mobilized with an Epstein  and a nerve hook and catheter lavage irrigation, removed multiple fragments.  Following this, this significantly decompressed the region.  I checked beneath the thecal sac, the axilla of the root, the foramen of L5 and S1 and the shoulder.  There was no  residual disk herniation noted, other than tethering epidural, it was cauterized and lysed, freed the S1 nerve root.  Following this, I copiously irrigated.  I obtained confirmatory radiographs with a Woodson retractor in the foramen of L5 and S1. I  spoke with the radiologist to confirm the space as there was a transitional segment  there.  We looked at a previous CT scan that was available.  Next, I irrigated it.  No evidence of active bleeding or CSF leakage.  I placed a very small 2 x 2 mm  thrombin-soaked Gelfoam into the lateral  recess.  Copiously irrigated once again.  I then removed the McCulloch retractor and inspected the paraspinous musculature.  No active bleeding.  I then closed the dorsolumbar fascia with 1 Vicryl interrupted  figure-of-eight suture, subcutaneous with multiple 2-0 layers and the skin with Prolene.  Sterile dressing applied, placed supine on the hospital bed, extubated without difficulty and transported to the recovery room in satisfactory condition.  The patient tolerated the procedure well.  No complications.  Assistant, Andrez Grime, [place].  Minimal blood loss.   SHW D: 12/21/2020 10:06:18 am T: 12/21/2020 10:50:00 am  JOB: [id number]/ [id number]

## 2020-12-21 NOTE — Plan of Care (Signed)
Pt doing well. Pt and husband given D/C instructions with verbal understanding. Pt's incision is clean and dry with no sign of infection. Pt's IV was removed prior to D/C. Pt D/C'd home via walking per MD order. Pt is stable @ D/C and has no other needs at this time. Rema Fendt, RN

## 2020-12-22 ENCOUNTER — Encounter (HOSPITAL_COMMUNITY): Payer: Self-pay | Admitting: Specialist

## 2021-11-12 ENCOUNTER — Ambulatory Visit
Admission: RE | Admit: 2021-11-12 | Discharge: 2021-11-12 | Disposition: A | Discharge status: Home / Self Care | Payer: BC Managed Care – PPO | Source: Ambulatory Visit | Attending: Student | Admitting: Student

## 2021-11-12 ENCOUNTER — Other Ambulatory Visit: Payer: Self-pay | Admitting: Student

## 2021-11-12 DIAGNOSIS — M79662 Pain in left lower leg: Secondary | ICD-10-CM | POA: Diagnosis present

## 2022-01-24 ENCOUNTER — Other Ambulatory Visit: Payer: Self-pay | Admitting: Internal Medicine

## 2022-01-24 DIAGNOSIS — Z1231 Encounter for screening mammogram for malignant neoplasm of breast: Secondary | ICD-10-CM

## 2022-02-14 ENCOUNTER — Other Ambulatory Visit: Payer: Self-pay | Admitting: Internal Medicine

## 2022-02-14 ENCOUNTER — Other Ambulatory Visit (HOSPITAL_COMMUNITY): Payer: Self-pay | Admitting: Internal Medicine

## 2022-02-14 ENCOUNTER — Ambulatory Visit
Admission: RE | Admit: 2022-02-14 | Discharge: 2022-02-14 | Disposition: A | Discharge status: Home / Self Care | Payer: BC Managed Care – PPO | Source: Ambulatory Visit | Attending: Internal Medicine | Admitting: Internal Medicine

## 2022-02-14 DIAGNOSIS — R042 Hemoptysis: Secondary | ICD-10-CM

## 2022-02-14 DIAGNOSIS — J189 Pneumonia, unspecified organism: Secondary | ICD-10-CM

## 2022-03-08 ENCOUNTER — Ambulatory Visit
Admission: RE | Admit: 2022-03-08 | Discharge: 2022-03-08 | Disposition: A | Discharge status: Home / Self Care | Payer: BC Managed Care – PPO | Source: Ambulatory Visit | Attending: Internal Medicine | Admitting: Internal Medicine

## 2022-03-08 DIAGNOSIS — Z1231 Encounter for screening mammogram for malignant neoplasm of breast: Secondary | ICD-10-CM | POA: Diagnosis present

## 2022-03-21 ENCOUNTER — Other Ambulatory Visit: Payer: Self-pay | Admitting: Cardiovascular Disease

## 2022-03-21 DIAGNOSIS — J189 Pneumonia, unspecified organism: Secondary | ICD-10-CM

## 2022-04-04 ENCOUNTER — Ambulatory Visit
Admission: RE | Admit: 2022-04-04 | Discharge: 2022-04-04 | Disposition: A | Discharge status: Home / Self Care | Payer: BC Managed Care – PPO | Source: Ambulatory Visit | Attending: Infectious Diseases | Admitting: Infectious Diseases

## 2022-04-04 DIAGNOSIS — J189 Pneumonia, unspecified organism: Secondary | ICD-10-CM | POA: Diagnosis present

## 2022-07-05 IMAGING — CR DG LUMBAR SPINE 2-3V
2 series · 2 of 2 positions shown · non-contrast
Comparison: None available

CLINICAL DATA: Lumbar disc herniation, pre-admission exam for
lumbar spine surgery

EXAM:
LUMBAR SPINE - 2-3 VIEW

[w lumbar spine ap]
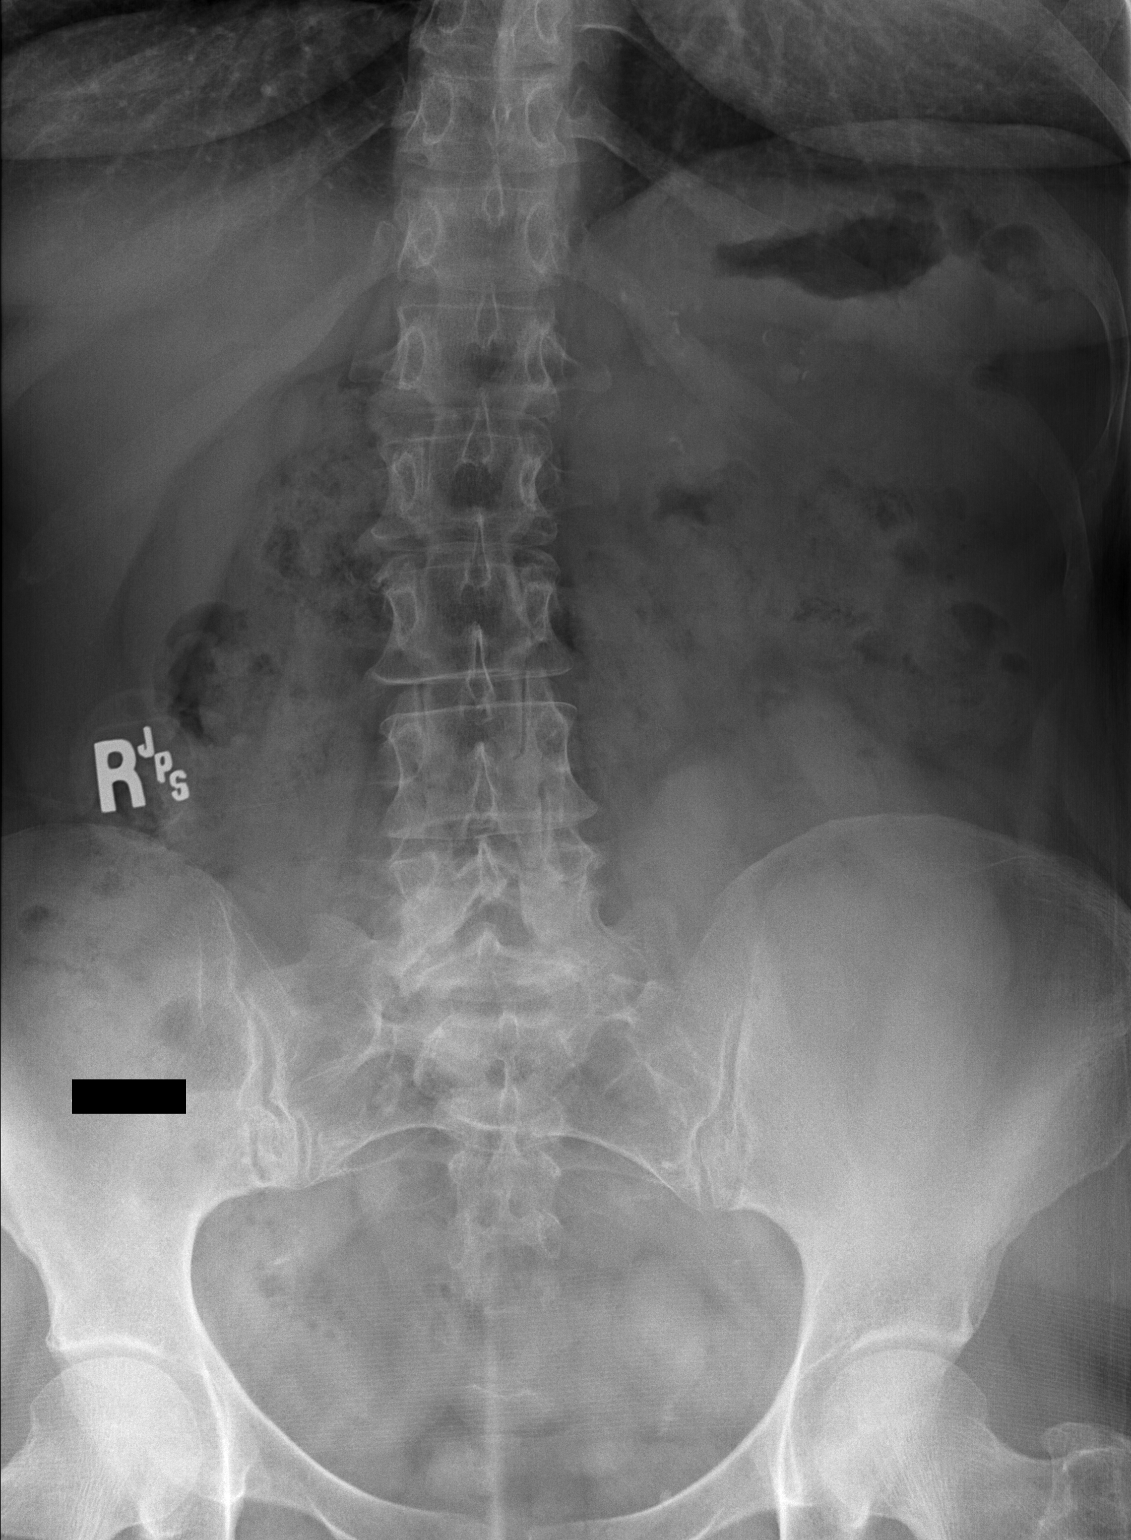

[w lumbar spine lat]
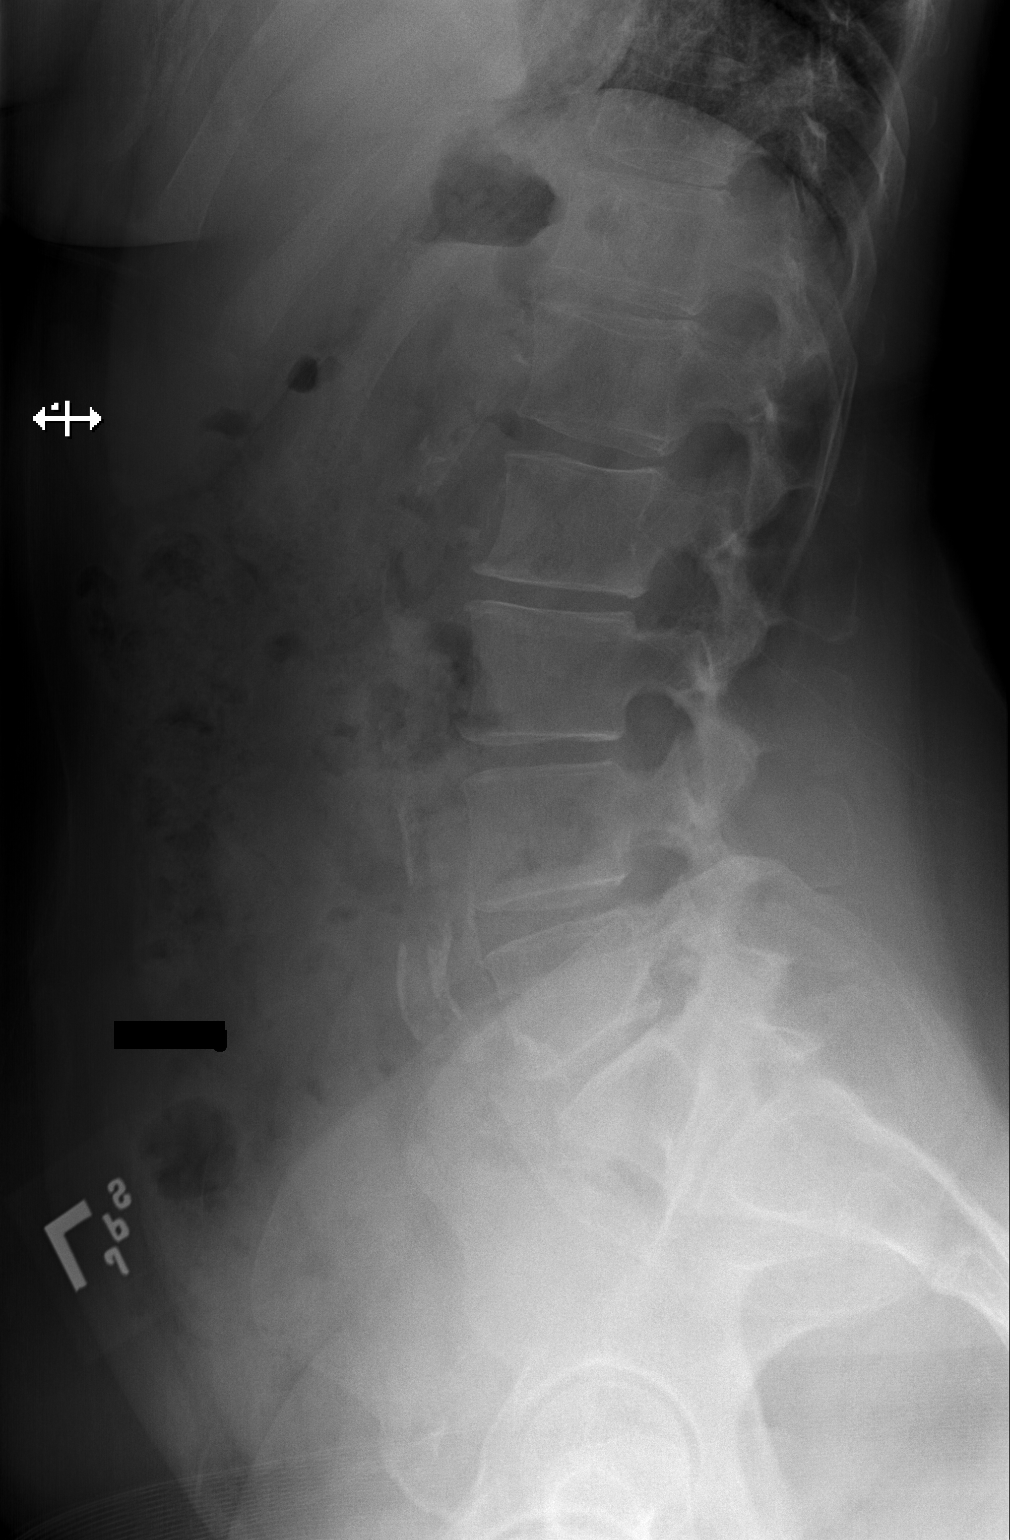

[2 of 2 positions shown; findings below may reference images not displayed]

FINDINGS: Slight dextrocurvature of the lower thoracic and lumbar spine.
Otherwise normal alignment. Preserved vertebral body heights. Slight
disc space narrowing at L2-3 and L5-S1.

Facets are aligned. Aortoiliac bifurcation atherosclerosis noted.
Normal SI joints for age. Included pelvis unremarkable.
Nonobstructive bowel gas pattern.
IMPRESSION: No acute finding by plain radiography.

Degenerative disc disease, as above.

## 2022-07-11 IMAGING — CR DG LUMBAR SPINE 2-3V
3 series · 3 of 3 positions shown · non-contrast
Comparison: Comparison made with Thursday January, 2016 CT evaluation.
COMPARISON: Comparison made with Thursday January, 2016 CT evaluation.

Addendum:
CLINICAL DATA: Intraop localization for potential transitional
anatomy.

EXAM:
LUMBAR SPINE - 2-3 VIEW

[lateral (1 of 3)]
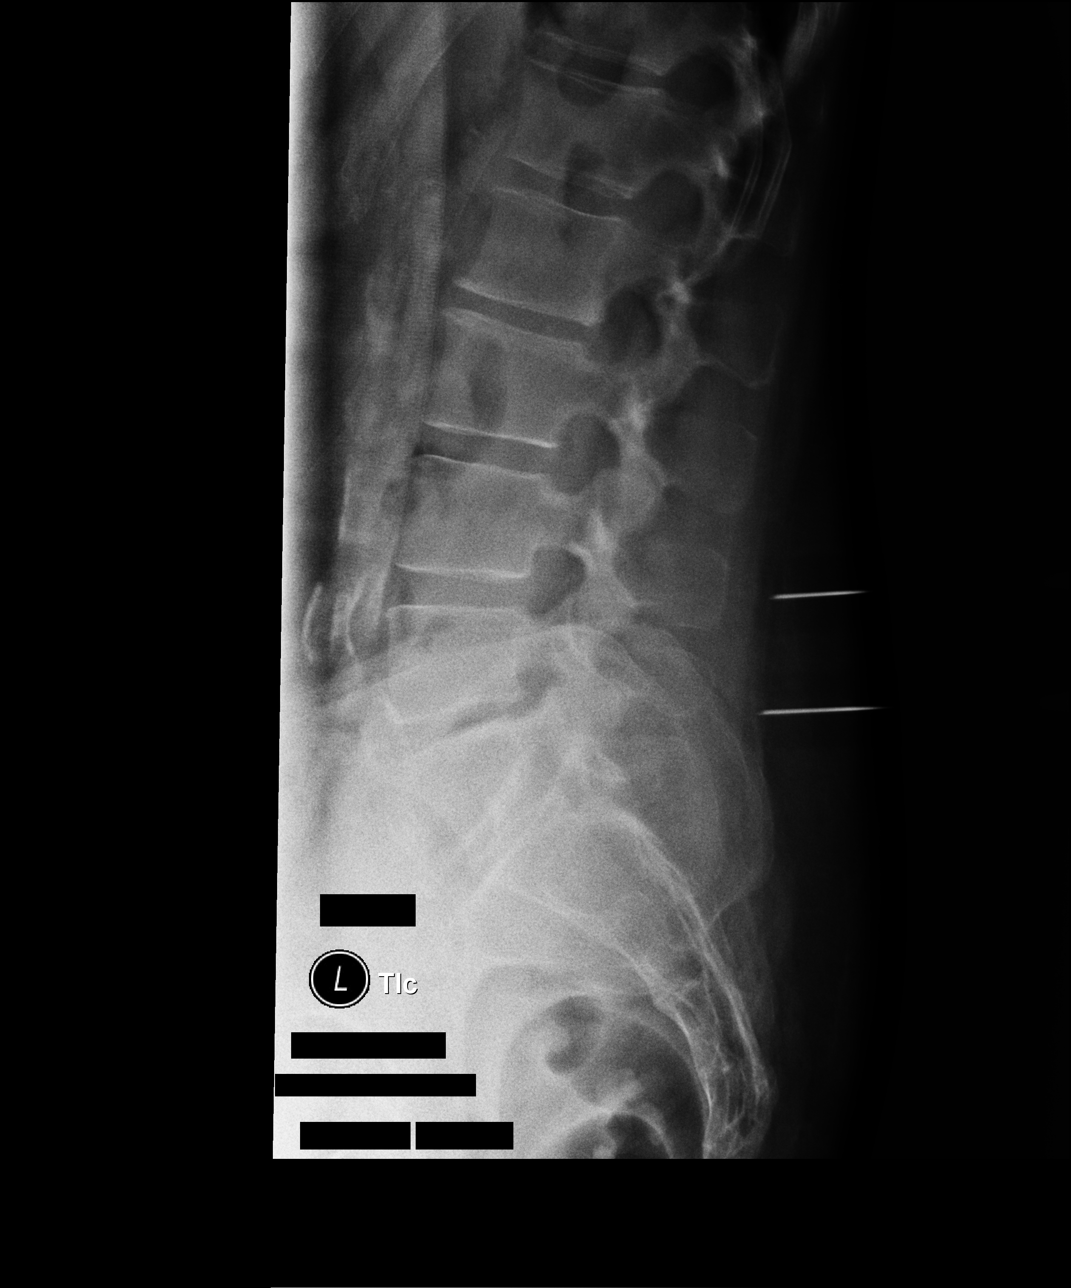

[lateral (2 of 3)]
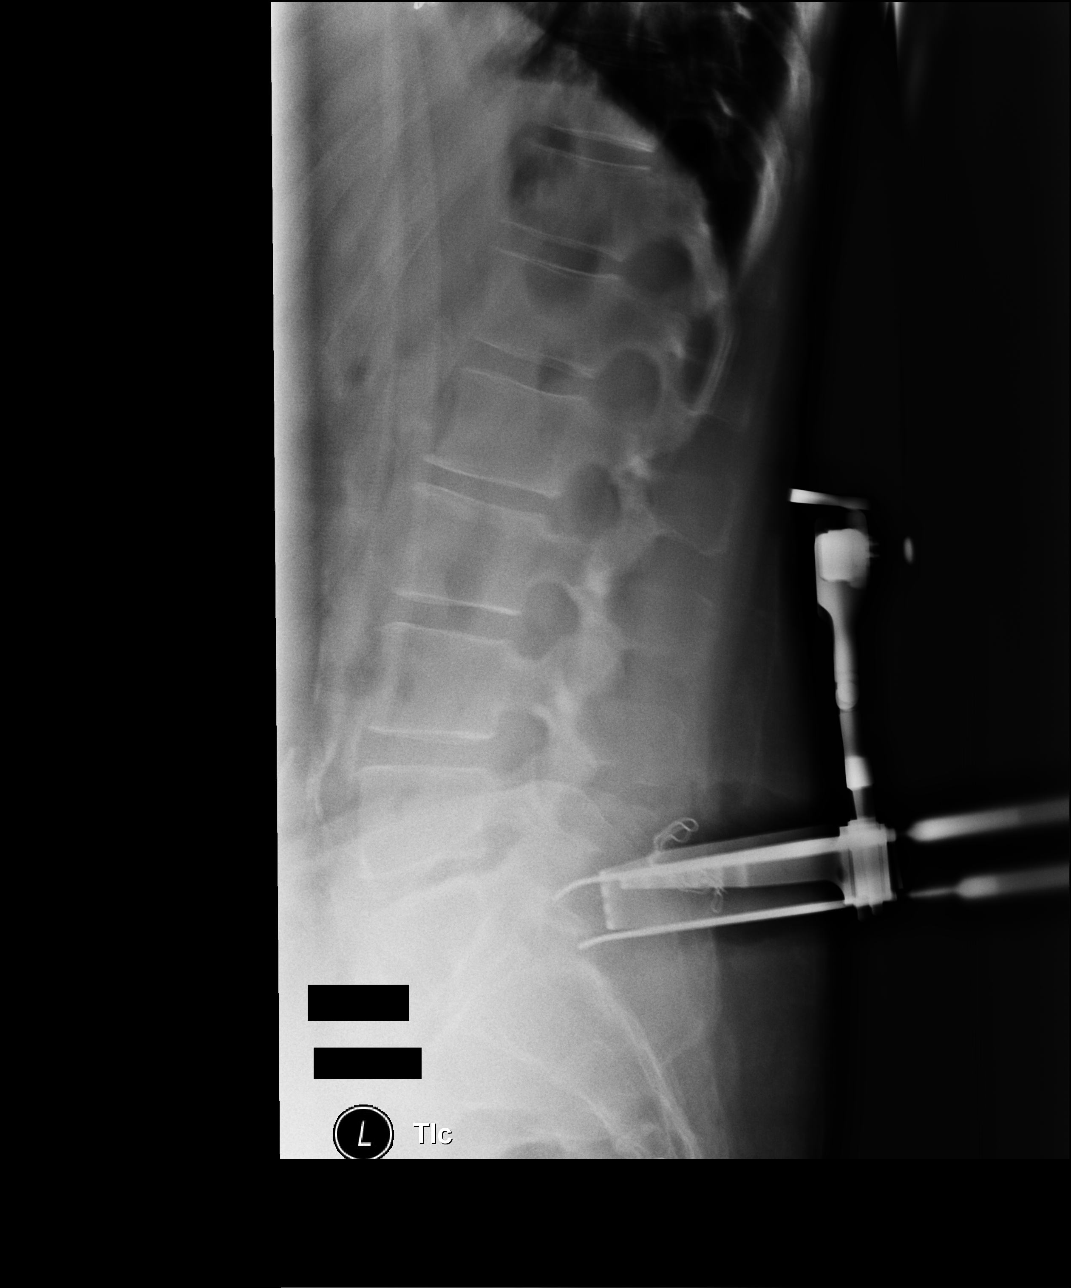

[lateral (3 of 3)]
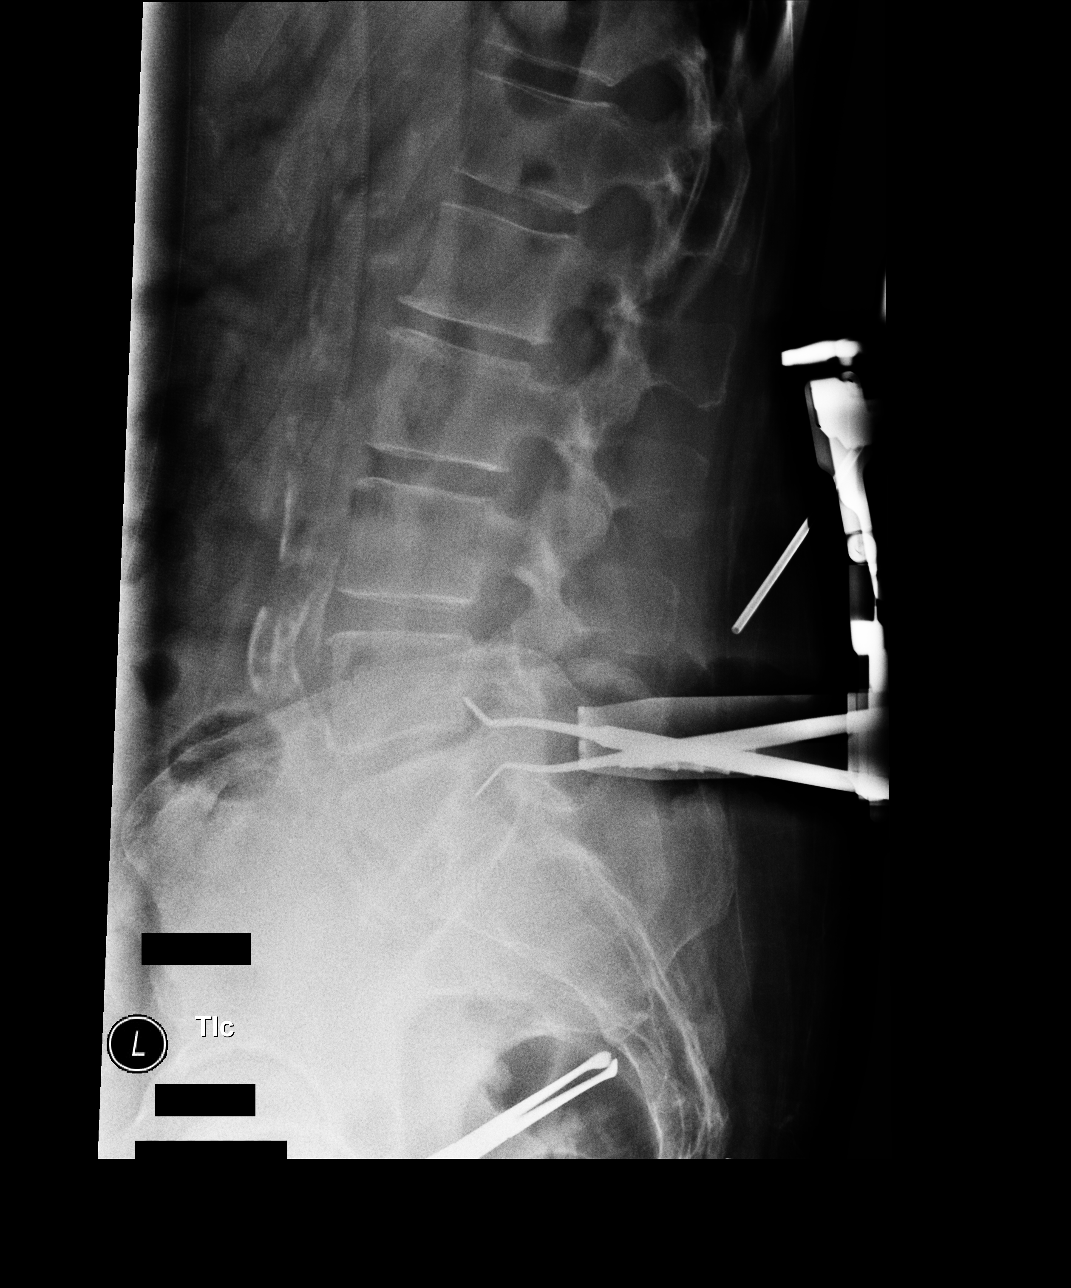

[3 of 3 positions shown; findings below may reference images not displayed]

FINDINGS: There are 5 lumbar type vertebral bodies as counted from the last
rib-bearing vertebral body

S1 has transitional a transitional type anatomy.

There is minimal retrolisthesis of L5 on S1 and there is disc space
narrowing. Retrolisthesis in the 4 mm range. Comparison with
previous CT imaging from Thursday January, 2016 shows a similar appearance
to disc levels and anatomy.

On the [DATE] of images there are 2 radiopaque indicators the
superior indicator is directed at the L4-5 level, the inferior
indicator directed at the L5-S1 level.

The second image shows a retractor projecting posterior to the
"S1-S2" level.

The third image submitted shows retractors in place, the superior
retractor is directed at the L5-S1 neural foramen, the inferior is
directed at the S1-S2 neural foramen.
IMPRESSION: Localization of the L5-S1 and S1-S2 neural foramen as described.

On the last set of images the superior retractor is directed at the
L5-S1 level and the inferior retractor is directed at S1-S2.

ADDENDUM:
Findings above were discussed with the referring physician as
outlined below.

These results were called by telephone at the time of interpretation
on 12/21/2020 at 6466 am to provider JOVON DU , who verbally
acknowledged these results.

*** End of Addendum ***
FINDINGS: There are 5 lumbar type vertebral bodies as counted from the last
rib-bearing vertebral body

S1 has transitional a transitional type anatomy.

There is minimal retrolisthesis of L5 on S1 and there is disc space
narrowing. Retrolisthesis in the 4 mm range. Comparison with
previous CT imaging from Thursday January, 2016 shows a similar appearance
to disc levels and anatomy.

On the [DATE] of images there are 2 radiopaque indicators the
superior indicator is directed at the L4-5 level, the inferior
indicator directed at the L5-S1 level.

The second image shows a retractor projecting posterior to the
"S1-S2" level.

The third image submitted shows retractors in place, the superior
retractor is directed at the L5-S1 neural foramen, the inferior is
directed at the S1-S2 neural foramen.
IMPRESSION: Localization of the L5-S1 and S1-S2 neural foramen as described.

On the last set of images the superior retractor is directed at the
L5-S1 level and the inferior retractor is directed at S1-S2.

## 2023-06-26 ENCOUNTER — Other Ambulatory Visit: Payer: Self-pay | Admitting: Internal Medicine

## 2023-06-26 DIAGNOSIS — R410 Disorientation, unspecified: Secondary | ICD-10-CM

## 2023-06-26 DIAGNOSIS — R42 Dizziness and giddiness: Secondary | ICD-10-CM

## 2023-06-26 DIAGNOSIS — G44229 Chronic tension-type headache, not intractable: Secondary | ICD-10-CM

## 2023-06-28 ENCOUNTER — Ambulatory Visit
Admission: RE | Admit: 2023-06-28 | Discharge: 2023-06-28 | Disposition: A | Discharge status: Home / Self Care | Source: Ambulatory Visit | Attending: Internal Medicine | Admitting: Internal Medicine

## 2023-06-28 DIAGNOSIS — G44229 Chronic tension-type headache, not intractable: Secondary | ICD-10-CM | POA: Diagnosis present

## 2023-06-28 DIAGNOSIS — R410 Disorientation, unspecified: Secondary | ICD-10-CM | POA: Diagnosis present

## 2023-06-28 DIAGNOSIS — R42 Dizziness and giddiness: Secondary | ICD-10-CM | POA: Diagnosis present

## 2023-10-17 ENCOUNTER — Emergency Department (HOSPITAL_COMMUNITY)
Admission: EM | Admit: 2023-10-17 | Discharge: 2023-10-17 | Disposition: A | Discharge status: Home / Self Care | Attending: Emergency Medicine | Admitting: Emergency Medicine

## 2023-10-17 ENCOUNTER — Other Ambulatory Visit: Payer: Self-pay

## 2023-10-17 ENCOUNTER — Encounter (HOSPITAL_COMMUNITY): Payer: Self-pay | Admitting: Emergency Medicine

## 2023-10-17 DIAGNOSIS — T782XXA Anaphylactic shock, unspecified, initial encounter: Secondary | ICD-10-CM | POA: Diagnosis not present

## 2023-10-17 DIAGNOSIS — Z79899 Other long term (current) drug therapy: Secondary | ICD-10-CM | POA: Insufficient documentation

## 2023-10-17 DIAGNOSIS — E860 Dehydration: Secondary | ICD-10-CM | POA: Insufficient documentation

## 2023-10-17 DIAGNOSIS — L299 Pruritus, unspecified: Secondary | ICD-10-CM | POA: Diagnosis present

## 2023-10-17 DIAGNOSIS — E876 Hypokalemia: Secondary | ICD-10-CM | POA: Insufficient documentation

## 2023-10-17 DIAGNOSIS — I10 Essential (primary) hypertension: Secondary | ICD-10-CM | POA: Insufficient documentation

## 2023-10-17 LAB — BASIC METABOLIC PANEL WITH GFR
Anion gap: 17 — ABNORMAL HIGH (ref 5–15)
BUN: 16 mg/dL (ref 6–20)
CO2: 21 mmol/L — ABNORMAL LOW (ref 22–32)
Calcium: 9.6 mg/dL (ref 8.9–10.3)
Chloride: 103 mmol/L (ref 98–111)
Creatinine, Ser: 1.54 mg/dL — ABNORMAL HIGH (ref 0.44–1.00)
GFR, Estimated: 40 mL/min — ABNORMAL LOW (ref >60)
Glucose, Bld: 144 mg/dL — ABNORMAL HIGH (ref 70–99)
Potassium: 2.9 mmol/L — ABNORMAL LOW (ref 3.5–5.1)
Sodium: 141 mmol/L (ref 135–145)

## 2023-10-17 LAB — CBC WITH DIFFERENTIAL/PLATELET
Abs Immature Granulocytes: 0.02 10*3/uL (ref 0.00–0.07)
Basophils Absolute: 0.1 10*3/uL (ref 0.0–0.1)
Basophils Relative: 1 %
Eosinophils Absolute: 0.2 10*3/uL (ref 0.0–0.5)
Eosinophils Relative: 2 %
HCT: 55.8 % — ABNORMAL HIGH (ref 36.0–46.0)
Hemoglobin: 19.2 g/dL — ABNORMAL HIGH (ref 12.0–15.0)
Immature Granulocytes: 0 %
Lymphocytes Relative: 41 %
Lymphs Abs: 3.3 10*3/uL (ref 0.7–4.0)
MCH: 30.1 pg (ref 26.0–34.0)
MCHC: 34.4 g/dL (ref 30.0–36.0)
MCV: 87.6 fL (ref 80.0–100.0)
Monocytes Absolute: 0.5 10*3/uL (ref 0.1–1.0)
Monocytes Relative: 7 %
Neutro Abs: 4.1 10*3/uL (ref 1.7–7.7)
Neutrophils Relative %: 49 %
Platelets: 716 10*3/uL — ABNORMAL HIGH (ref 150–400)
RBC: 6.37 MIL/uL — ABNORMAL HIGH (ref 3.87–5.11)
RDW: 15.3 % (ref 11.5–15.5)
WBC: 8.2 10*3/uL (ref 4.0–10.5)
nRBC: 0 % (ref 0.0–0.2)

## 2023-10-17 MED ORDER — POTASSIUM CHLORIDE CRYS ER 20 MEQ PO TBCR
40.0000 meq | EXTENDED_RELEASE_TABLET | Freq: Once | ORAL | Status: AC
  Administered 2023-10-17: 40 meq via ORAL
  Filled 2023-10-17: qty 2

## 2023-10-17 MED ORDER — EPINEPHRINE 0.3 MG/0.3ML IJ SOAJ: 0.3000 mg | INTRAMUSCULAR | 0 refills | Status: AC | PRN

## 2023-10-17 MED ORDER — EPINEPHRINE 0.3 MG/0.3ML IJ SOAJ
INTRAMUSCULAR | Status: AC
  Filled 2023-10-17: qty 0.3

## 2023-10-17 MED ORDER — LACTATED RINGERS IV BOLUS
1000.0000 mL | Freq: Once | INTRAVENOUS | Status: AC
  Administered 2023-10-17: 1000 mL via INTRAVENOUS

## 2023-10-17 MED ORDER — FAMOTIDINE IN NACL 20-0.9 MG/50ML-% IV SOLN
20.0000 mg | Freq: Once | INTRAVENOUS | Status: AC
  Administered 2023-10-17: 20 mg via INTRAVENOUS
  Filled 2023-10-17: qty 50

## 2023-10-17 MED ORDER — POTASSIUM CHLORIDE CRYS ER 20 MEQ PO TBCR: 20.0000 meq | EXTENDED_RELEASE_TABLET | Freq: Every day | ORAL | 0 refills | Status: AC

## 2023-10-17 MED ORDER — DIPHENHYDRAMINE HCL 50 MG/ML IJ SOLN
25.0000 mg | Freq: Once | INTRAMUSCULAR | Status: AC
  Administered 2023-10-17: 25 mg via INTRAVENOUS
  Filled 2023-10-17: qty 1

## 2023-10-17 MED ORDER — METHYLPREDNISOLONE SODIUM SUCC 125 MG IJ SOLR
125.0000 mg | Freq: Once | INTRAMUSCULAR | Status: AC
  Administered 2023-10-17: 125 mg via INTRAVENOUS
  Filled 2023-10-17: qty 2

## 2023-10-17 NOTE — ED Triage Notes (Signed)
 Pov from work. Cc of allergic reaction. Works at [employer] and said she was around a Therapist, nutritional. Patient diaphoretic, hives throughout body.

## 2023-10-17 NOTE — ED Notes (Signed)
 Pt's breathing has become more regular. Sweating has decreased.

## 2023-10-17 NOTE — ED Provider Notes (Signed)
 [facility]  Provider Note  CSN: [id number] Arrival date & time: 10/17/23 0121  History Chief Complaint  Patient presents with  . Allergic Reaction    Miranda Harvey is a 58 y.o. 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 She began feeling dizzy and so she came to the ED. She has otherwise been in her usual state of health recently without fever, cough, SOB or chest pains. She has never had a reaction like this, but reports the yarn was new.    Home Medications Prior to Admission medications   Medication Sig Start Date End Date Taking? Authorizing Provider  EPINEPHrine  0.3 mg/0.3 mL IJ SOAJ injection Inject 0.3 mg into the muscle as needed for anaphylaxis. 10/17/23  Yes Roselyn Carlin NOVAK, MD  potassium chloride SA (KLOR-CON M) 20 MEQ tablet Take 1 tablet (20 mEq total) by mouth daily for 3 days. 10/17/23 10/20/23 Yes Roselyn Carlin NOVAK, MD  amLODipine  (NORVASC ) 10 MG tablet Take 10 mg by mouth daily.    [provider]  cetirizine (ZYRTEC) 10 MG tablet Take 10 mg by mouth daily.    [provider]  cholecalciferol (VITAMIN D3) 25 MCG (1000 UNIT) tablet Take 1,000 Units by mouth daily.    [provider]  docusate sodium  (COLACE) 100 MG capsule Take 1 capsule (100 mg total) by mouth 2 (two) times daily as needed for mild constipation. 12/21/20   Duwayne Purchase, MD  hydrochlorothiazide (HYDRODIURIL) 25 MG tablet Take 12.5 mg by mouth daily. 10/04/16   [provider]  methocarbamol  (ROBAXIN ) 500 MG tablet Take 1 tablet (500 mg total) by mouth every 8 (eight) hours as needed for muscle spasms. 12/21/20   Duwayne Purchase, MD  oxyCODONE  (OXY IR/ROXICODONE ) 5 MG immediate release tablet Take 1-2 tablets (5-10 mg total) by mouth every 4 (four) hours as needed for severe pain. 12/21/20   Duwayne Purchase, MD  polyethylene glycol (MIRALAX  / GLYCOLAX ) 17 g packet Take 17 g by mouth daily. 12/21/20   Duwayne Purchase, MD  venlafaxine  XR (EFFEXOR -XR) 37.5 MG 24 hr capsule Take 75 mg by mouth daily with breakfast.    [provider]  vitamin B-12 (CYANOCOBALAMIN) 500 MCG tablet Take 500 mcg by mouth daily.    [provider]     Allergies    Penicillins   Review of Systems   Review of Systems Please see HPI for pertinent positives and negatives  Physical Exam BP 99/81   Pulse 70   Temp (!) 97.5 F (36.4 C) (Oral)   Resp 15   Ht 5' 6 (1.676 m)   Wt 84 kg   LMP  (LMP Unknown)   SpO2 98%   BMI 29.89 kg/m   Physical Exam Vitals and nursing note reviewed.  Constitutional:      Appearance: She is ill-appearing and diaphoretic.  HENT:     Head: Normocephalic and atraumatic.     Nose: Nose normal.     Mouth/Throat:     Mouth: Mucous membranes are dry.     Comments: No angioedema of tongue  Eyes:     Extraocular Movements: Extraocular movements intact.     Conjunctiva/sclera: Conjunctivae normal.    Cardiovascular:     Rate and Rhythm: Normal rate.  Pulmonary:     Effort: Pulmonary  effort is normal.     Breath sounds: Normal breath sounds. No stridor. No wheezing.  Abdominal:     General: Abdomen is flat.     Palpations: Abdomen is soft.     Tenderness: There is no abdominal tenderness.   Musculoskeletal:        General: No swelling. Normal range of motion.     Cervical back: Neck supple.   Skin:    General: Skin is warm.     Findings: Rash (hives to arms, legs and upper chest) present.   Neurological:     General: No focal deficit present.     Mental Status: She is alert.   Psychiatric:        Mood and Affect: Mood normal.     ED Results / Procedures / Treatments   EKG EKG Interpretation Date/Time:  Friday October 17 2023 01:32:35 EDT Ventricular Rate:  74 PR Interval:  133 QRS Duration:  88 QT Interval:  465 QTC  Calculation: 516 R Axis:   77  Text Interpretation: Sinus rhythm Atrial premature complexes Biatrial enlargement Left ventricular hypertrophy Prolonged QT interval Baseline wander in lead(s) V1 V2 V6 No significant change since last tracing Confirmed by Roselyn Dunnings [phone]) on 10/17/2023 1:41:41 AM  Procedures .Critical Care  Performed by: Roselyn Dunnings NOVAK, MD Authorized by: Roselyn Dunnings NOVAK, MD   Critical care provider statement:    Critical care time (minutes):  40   Critical care time was exclusive of:  Separately billable procedures and treating other patients   Critical care was necessary to treat or prevent imminent or life-threatening deterioration of the following conditions:  Shock   Critical care was time spent personally by me on the following activities:  Development of treatment plan with patient or surrogate, discussions with consultants, evaluation of patient's response to treatment, examination of patient, ordering and review of laboratory studies, ordering and review of radiographic studies, ordering and performing treatments and interventions, pulse oximetry, re-evaluation of patient's condition and review of old charts   Medications Ordered in the ED Medications  EPINEPHrine  (EPI-PEN) 0.3 mg/0.3 mL injection (  Given by Other 10/17/23 0140)  diphenhydrAMINE (BENADRYL) injection 25 mg (25 mg Intravenous Given 10/17/23 0154)  methylPREDNISolone sodium succinate (SOLU-MEDROL) 125 mg/2 mL injection 125 mg (125 mg Intravenous Given 10/17/23 0151)  famotidine (PEPCID) IVPB 20 mg premix (0 mg Intravenous Stopped 10/17/23 0216)  lactated ringers  bolus 1,000 mL (0 mLs Intravenous Stopped 10/17/23 0247)  potassium chloride SA (KLOR-CON M) CR tablet 40 mEq (40 mEq Oral Given 10/17/23 0215)    Initial Impression and Plan  Patient here with apparent allergic reaction to something at work. Noted to have hives as well as being diaphoretic and hypotensive on arrival. Will give  Epi-Pen, IVF, as well as steroids and antihistamines. Check basic labs and monitor closely.   ED Course   Clinical Course as of 10/17/23 0524  Fri Oct 17, 2023  0200 CBC with increased Hgb and PLT, BMP with increased Cr from baseline, could be dehydration/concentration. Will give IVF bolus as above. K is low, repleted orally.  [CS]  0200 BP improving.  [CS]  0409 Patient had some itching to feet but that has resolved. She is feeling much better, ambulating without dizziness. Will continue to observe for rebound reaction x 4 hours after EpiPen .  [CS]  9476 Patient continues to do well. Will d/c with Rx for EpiPen  and K-dur. Advised to use EpiPen  if she has another reaction, PCP follow  up, RTED for any other concerns.   [CS]    Clinical Course User Index [CS] Roselyn Carlin NOVAK, MD     MDM Rules/Calculators/A&P Medical Decision Making Given presenting complaint, I considered that admission might be necessary. After review of results from ED lab and/or imaging studies, admission to the hospital is not indicated at this time.    Problems Addressed: Anaphylaxis, initial encounter: acute illness or injury Dehydration: acute illness or injury Hypokalemia: acute illness or injury  Amount and/or Complexity of Data Reviewed Labs: ordered. Decision-making details documented in ED Course.  Risk Prescription drug management. Decision regarding hospitalization.     Final Clinical Impression(s) / ED Diagnoses Final diagnoses:  Anaphylaxis, initial encounter  Hypokalemia  Dehydration    Rx / DC Orders ED Discharge Orders          Ordered    EPINEPHrine  0.3 mg/0.3 mL IJ SOAJ injection  As needed        10/17/23 0521    potassium chloride SA (KLOR-CON M) 20 MEQ tablet  Daily        10/17/23 0521             Roselyn Carlin NOVAK, MD 10/17/23 [phone]

## 2023-10-17 NOTE — ED Notes (Signed)
 Pt wheeled to ED room. Diaphoretic, hives (upper and lower extremities), redness seen throughout body, and SOB. Pt is A&Ox4.

## 2023-10-27 ENCOUNTER — Other Ambulatory Visit: Payer: Self-pay | Admitting: Internal Medicine

## 2023-10-27 DIAGNOSIS — Z1231 Encounter for screening mammogram for malignant neoplasm of breast: Secondary | ICD-10-CM

## 2023-11-14 ENCOUNTER — Ambulatory Visit
Admission: RE | Admit: 2023-11-14 | Discharge: 2023-11-14 | Disposition: A | Discharge status: Home / Self Care | Source: Ambulatory Visit | Attending: Internal Medicine | Admitting: Internal Medicine

## 2023-11-14 DIAGNOSIS — Z1231 Encounter for screening mammogram for malignant neoplasm of breast: Secondary | ICD-10-CM | POA: Diagnosis present

## 2023-12-02 ENCOUNTER — Encounter (HOSPITAL_COMMUNITY): Payer: Self-pay

## 2023-12-02 ENCOUNTER — Inpatient Hospital Stay (HOSPITAL_COMMUNITY)
Admission: EM | Admit: 2023-12-02 | Discharge: 2023-12-03 | DRG: 103 | Disposition: A | Discharge status: Home / Self Care | Attending: Neurology | Admitting: Neurology

## 2023-12-02 ENCOUNTER — Emergency Department (HOSPITAL_COMMUNITY)

## 2023-12-02 ENCOUNTER — Other Ambulatory Visit: Payer: Self-pay

## 2023-12-02 DIAGNOSIS — G43109 Migraine with aura, not intractable, without status migrainosus: Secondary | ICD-10-CM | POA: Diagnosis present

## 2023-12-02 DIAGNOSIS — E7800 Pure hypercholesterolemia, unspecified: Secondary | ICD-10-CM | POA: Diagnosis present

## 2023-12-02 DIAGNOSIS — F17210 Nicotine dependence, cigarettes, uncomplicated: Secondary | ICD-10-CM | POA: Diagnosis present

## 2023-12-02 DIAGNOSIS — Z8673 Personal history of transient ischemic attack (TIA), and cerebral infarction without residual deficits: Secondary | ICD-10-CM | POA: Diagnosis not present

## 2023-12-02 DIAGNOSIS — Z91048 Other nonmedicinal substance allergy status: Secondary | ICD-10-CM | POA: Diagnosis not present

## 2023-12-02 DIAGNOSIS — Z91030 Bee allergy status: Secondary | ICD-10-CM

## 2023-12-02 DIAGNOSIS — Z880 Allergy status to penicillin: Secondary | ICD-10-CM

## 2023-12-02 DIAGNOSIS — R29700 NIHSS score 0: Secondary | ICD-10-CM | POA: Diagnosis not present

## 2023-12-02 DIAGNOSIS — I10 Essential (primary) hypertension: Secondary | ICD-10-CM | POA: Diagnosis present

## 2023-12-02 DIAGNOSIS — Z809 Family history of malignant neoplasm, unspecified: Secondary | ICD-10-CM

## 2023-12-02 DIAGNOSIS — F32A Depression, unspecified: Secondary | ICD-10-CM | POA: Diagnosis present

## 2023-12-02 DIAGNOSIS — Z79899 Other long term (current) drug therapy: Secondary | ICD-10-CM

## 2023-12-02 DIAGNOSIS — R2990 Unspecified symptoms and signs involving the nervous system: Secondary | ICD-10-CM | POA: Insufficient documentation

## 2023-12-02 DIAGNOSIS — I639 Cerebral infarction, unspecified: Secondary | ICD-10-CM | POA: Diagnosis present

## 2023-12-02 DIAGNOSIS — Z833 Family history of diabetes mellitus: Secondary | ICD-10-CM

## 2023-12-02 DIAGNOSIS — Z7982 Long term (current) use of aspirin: Secondary | ICD-10-CM

## 2023-12-02 DIAGNOSIS — F418 Other specified anxiety disorders: Secondary | ICD-10-CM | POA: Diagnosis present

## 2023-12-02 LAB — DIFFERENTIAL
Abs Immature Granulocytes: 0.07 K/uL (ref 0.00–0.07)
Basophils Absolute: 0.1 K/uL (ref 0.0–0.1)
Basophils Relative: 1 %
Eosinophils Absolute: 0.4 K/uL (ref 0.0–0.5)
Eosinophils Relative: 3 %
Immature Granulocytes: 1 %
Lymphocytes Relative: 22 %
Lymphs Abs: 2.7 K/uL (ref 0.7–4.0)
Monocytes Absolute: 0.9 K/uL (ref 0.1–1.0)
Monocytes Relative: 7 %
Neutro Abs: 8.2 K/uL — ABNORMAL HIGH (ref 1.7–7.7)
Neutrophils Relative %: 66 %
Smear Review: NORMAL

## 2023-12-02 LAB — RAPID URINE DRUG SCREEN, HOSP PERFORMED
Amphetamines: NOT DETECTED
Barbiturates: NOT DETECTED
Benzodiazepines: NOT DETECTED
Cocaine: NOT DETECTED
Opiates: NOT DETECTED
Tetrahydrocannabinol: NOT DETECTED

## 2023-12-02 LAB — CBC
HCT: 53.3 % — ABNORMAL HIGH (ref 36.0–46.0)
Hemoglobin: 17.9 g/dL — ABNORMAL HIGH (ref 12.0–15.0)
MCH: 30.5 pg (ref 26.0–34.0)
MCHC: 33.6 g/dL (ref 30.0–36.0)
MCV: 91 fL (ref 80.0–100.0)
Platelets: 604 K/uL — ABNORMAL HIGH (ref 150–400)
RBC: 5.86 MIL/uL — ABNORMAL HIGH (ref 3.87–5.11)
RDW: 15.1 % (ref 11.5–15.5)
WBC: 12.3 K/uL — ABNORMAL HIGH (ref 4.0–10.5)
nRBC: 0 % (ref 0.0–0.2)

## 2023-12-02 LAB — COMPREHENSIVE METABOLIC PANEL WITH GFR
ALT: 13 U/L (ref 0–44)
AST: 21 U/L (ref 15–41)
Albumin: 4.5 g/dL (ref 3.5–5.0)
Alkaline Phosphatase: 89 U/L (ref 38–126)
Anion gap: 13 (ref 5–15)
BUN: 17 mg/dL (ref 6–20)
CO2: 25 mmol/L (ref 22–32)
Calcium: 9.5 mg/dL (ref 8.9–10.3)
Chloride: 100 mmol/L (ref 98–111)
Creatinine, Ser: 1.21 mg/dL — ABNORMAL HIGH (ref 0.44–1.00)
GFR, Estimated: 53 mL/min — ABNORMAL LOW (ref >60)
Glucose, Bld: 92 mg/dL (ref 70–99)
Potassium: 3.1 mmol/L — ABNORMAL LOW (ref 3.5–5.1)
Sodium: 138 mmol/L (ref 135–145)
Total Bilirubin: 0.4 mg/dL (ref 0.0–1.2)
Total Protein: 7.7 g/dL (ref 6.5–8.1)

## 2023-12-02 LAB — CBG MONITORING, ED: Glucose-Capillary: 97 mg/dL (ref 70–99)

## 2023-12-02 LAB — I-STAT CHEM 8, ED
BUN: 17 mg/dL (ref 6–20)
Calcium, Ion: 1.08 mmol/L — ABNORMAL LOW (ref 1.15–1.40)
Chloride: 101 mmol/L (ref 98–111)
Creatinine, Ser: 1.3 mg/dL — ABNORMAL HIGH (ref 0.44–1.00)
Glucose, Bld: 91 mg/dL (ref 70–99)
HCT: 54 % — ABNORMAL HIGH (ref 36.0–46.0)
Hemoglobin: 18.4 g/dL — ABNORMAL HIGH (ref 12.0–15.0)
Potassium: 3.1 mmol/L — ABNORMAL LOW (ref 3.5–5.1)
Sodium: 139 mmol/L (ref 135–145)
TCO2: 24 mmol/L (ref 22–32)

## 2023-12-02 LAB — PROTIME-INR
INR: 0.9 (ref 0.8–1.2)
Prothrombin Time: 12.7 s (ref 11.4–15.2)

## 2023-12-02 LAB — MRSA NEXT GEN BY PCR, NASAL: MRSA by PCR Next Gen: NOT DETECTED

## 2023-12-02 LAB — ETHANOL: Alcohol, Ethyl (B): <15 mg/dL (ref <15)

## 2023-12-02 LAB — APTT: aPTT: 32 s (ref 24–36)

## 2023-12-02 MED ORDER — SODIUM CHLORIDE 0.9 % IV SOLN: INTRAVENOUS | Status: AC

## 2023-12-02 MED ORDER — PRAVASTATIN SODIUM 40 MG PO TABS: 40.0000 mg | ORAL_TABLET | Freq: Every day | ORAL | Status: DC

## 2023-12-02 MED ORDER — PANTOPRAZOLE SODIUM 40 MG IV SOLR: 40.0000 mg | Freq: Every day | INTRAVENOUS | Status: DC

## 2023-12-02 MED ORDER — TENECTEPLASE FOR STROKE
PACK | INTRAVENOUS | Status: AC
  Administered 2023-12-02 (×2): 17 mg via INTRAVENOUS
  Filled 2023-12-02: qty 10

## 2023-12-02 MED ORDER — ACETAMINOPHEN 160 MG/5ML PO SOLN: 650.0000 mg | ORAL | Status: DC | PRN

## 2023-12-02 MED ORDER — ESCITALOPRAM OXALATE 10 MG PO TABS
5.0000 mg | ORAL_TABLET | Freq: Every day | ORAL | Status: DC
  Administered 2023-12-03 (×2): 5 mg via ORAL
  Filled 2023-12-02: qty 1

## 2023-12-02 MED ORDER — ACETAMINOPHEN 325 MG PO TABS
650.0000 mg | ORAL_TABLET | ORAL | Status: DC | PRN
  Administered 2023-12-03 (×4): 650 mg via ORAL
  Filled 2023-12-02 (×2): qty 2

## 2023-12-02 MED ORDER — VENLAFAXINE HCL ER 75 MG PO CP24
75.0000 mg | ORAL_CAPSULE | Freq: Every day | ORAL | Status: DC
  Administered 2023-12-03 (×2): 75 mg via ORAL
  Filled 2023-12-02: qty 1

## 2023-12-02 MED ORDER — ACETAMINOPHEN 325 MG PO TABS: 650.0000 mg | ORAL_TABLET | Freq: Four times a day (QID) | ORAL | Status: DC | PRN

## 2023-12-02 MED ORDER — TRANEXAMIC ACID-NACL 1000-0.7 MG/100ML-% IV SOLN
INTRAVENOUS | Status: AC
  Filled 2023-12-02: qty 100

## 2023-12-02 MED ORDER — SENNOSIDES-DOCUSATE SODIUM 8.6-50 MG PO TABS: 1.0000 | ORAL_TABLET | Freq: Every evening | ORAL | Status: DC | PRN

## 2023-12-02 MED ORDER — STROKE: EARLY STAGES OF RECOVERY BOOK
Freq: Once | Status: AC
  Administered 2023-12-03 (×2): 1
  Filled 2023-12-02: qty 1

## 2023-12-02 MED ORDER — ORAL CARE MOUTH RINSE: 15.0000 mL | OROMUCOSAL | Status: DC | PRN

## 2023-12-02 MED ORDER — ACETAMINOPHEN 650 MG RE SUPP: 650.0000 mg | RECTAL | Status: DC | PRN

## 2023-12-02 MED ORDER — IOHEXOL 350 MG/ML SOLN
100.0000 mL | Freq: Once | INTRAVENOUS | Status: AC | PRN
  Administered 2023-12-02 (×2): 100 mL via INTRAVENOUS

## 2023-12-02 MED ORDER — PANTOPRAZOLE SODIUM 40 MG PO TBEC
40.0000 mg | DELAYED_RELEASE_TABLET | Freq: Every day | ORAL | Status: DC
  Filled 2023-12-02: qty 1

## 2023-12-02 MED ORDER — TENECTEPLASE FOR STROKE: 0.2500 mg/kg | PACK | Freq: Once | INTRAVENOUS | Status: AC

## 2023-12-02 MED ORDER — CHLORHEXIDINE GLUCONATE CLOTH 2 % EX PADS
6.0000 | MEDICATED_PAD | Freq: Every day | CUTANEOUS | Status: DC
  Administered 2023-12-02 – 2023-12-03 (×4): 6 via TOPICAL

## 2023-12-02 NOTE — ED Provider Notes (Signed)
 [place] EMERGENCY DEPARTMENT AT [facility] Provider Note   CSN: [id number] Arrival date & time: 12/02/23  9363  An emergency department physician performed an initial assessment on this suspected stroke patient at 36.  Patient presents with: Code Stroke   Miranda Harvey is a 58 y.o. female.  With a history of hypertension, hyperlipidemia and carotid stenosis who presents to the ED for stroke symptoms.  Patient was at work when at approximately 0555 she began to experience acute onset left-sided weakness leaning to the left, decree sensation on the left and trouble with balance.  She has never had similar onset of symptoms before.  She is awake alert oriented able to answer all questions.  No chest pain shortness of breath fevers chills or recent illness.  No anticoagulation.  {Add pertinent medical, surgical, social history, OB history to HPI:32947} HPI     Prior to Admission medications   Medication Sig Start Date End Date Taking? Authorizing Provider  amLODipine  (NORVASC ) 10 MG tablet Take 10 mg by mouth daily.    [provider]  cetirizine (ZYRTEC) 10 MG tablet Take 10 mg by mouth daily.    [provider]  cholecalciferol (VITAMIN D3) 25 MCG (1000 UNIT) tablet Take 1,000 Units by mouth daily.    [provider]  docusate sodium  (COLACE) 100 MG capsule Take 1 capsule (100 mg total) by mouth 2 (two) times daily as needed for mild constipation. 12/21/20   Duwayne Purchase, MD  EPINEPHrine  0.3 mg/0.3 mL IJ SOAJ injection Inject 0.3 mg into the muscle as needed for anaphylaxis. 10/17/23   Roselyn Carlin NOVAK, MD  hydrochlorothiazide (HYDRODIURIL) 25 MG tablet Take 12.5 mg by mouth daily. 10/04/16   [provider]  methocarbamol  (ROBAXIN ) 500 MG tablet Take 1 tablet (500 mg total) by mouth every 8 (eight) hours as needed for muscle spasms. 12/21/20   Duwayne Purchase, MD  oxyCODONE  (OXY IR/ROXICODONE ) 5 MG immediate release tablet Take 1-2 tablets  (5-10 mg total) by mouth every 4 (four) hours as needed for severe pain. 12/21/20   Duwayne Purchase, MD  polyethylene glycol (MIRALAX  / GLYCOLAX ) 17 g packet Take 17 g by mouth daily. 12/21/20   Duwayne Purchase, MD  potassium chloride  SA (KLOR-CON  M) 20 MEQ tablet Take 1 tablet (20 mEq total) by mouth daily for 3 days. 10/17/23 10/20/23  Roselyn Carlin NOVAK, MD  venlafaxine  XR (EFFEXOR -XR) 37.5 MG 24 hr capsule Take 75 mg by mouth daily with breakfast.    [provider]  vitamin B-12 (CYANOCOBALAMIN) 500 MCG tablet Take 500 mcg by mouth daily.    [provider]    Allergies: Penicillins    Review of Systems  Updated Vital Signs BP 133/87   Pulse 66   Temp 98.2 F (36.8 C)   Resp 20   Ht 5' 6 (1.676 m)   Wt 62.1 kg   LMP  (LMP Unknown)   SpO2 96%   BMI 22.11 kg/m   Physical Exam Vitals and nursing note reviewed.  HENT:     Head: Normocephalic and atraumatic.  Eyes:     Pupils: Pupils are equal, round, and reactive to light.  Cardiovascular:     Rate and Rhythm: Normal rate and regular rhythm.  Pulmonary:     Effort: Pulmonary effort is normal.     Breath sounds: Normal breath sounds.  Abdominal:     Palpations: Abdomen is soft.     Tenderness: There is no abdominal tenderness.  Skin:  General: Skin is warm and dry.  Neurological:     Mental Status: She is alert.     Comments: Awake alert oriented Unable to state month Clear fluent speech Positive drift in left lower extremity No drift in other extremities Sensation decreased to light touch on left side of face  1A: Level of Consciousness - Alert; keenly responsive + 0 1B: Ask Month and Age - 1 Question Right + 1 1C: Blink Eyes & Squeeze Hands - Performs Both Tasks + 0 2: Test Horizontal Extraocular Movements - Normal + 0 3: Test Visual Fields - No Visual Loss + 0 4: Test Facial Palsy (Use Grimace if Obtunded) - Normal symmetry + 0 5A: Test Left Arm Motor Drift - No Drift for 10 Seconds + 0 5B: Test  Right Arm Motor Drift - No Drift for 10 Seconds + 0 6A: Test Left Leg Motor Drift - Drift, but doesn't hit bed + 1 6B: Test Right Leg Motor Drift - No Drift for 5 Seconds + 0 7: Test Limb Ataxia (FNF/Heel-Shin) - No Ataxia + 0 8: Test Sensation - Mild-Moderate Loss: Less Sharp/More Dull + 1 9: Test Language/Aphasia - Normal; No aphasia + 0 10: Test Dysarthria - Normal + 0 11: Test Extinction/Inattention - No abnormality + 0   NIHSS Score: 3   Psychiatric:        Mood and Affect: Mood normal.     (all labs ordered are listed, but only abnormal results are displayed) Labs Reviewed  CBC - Abnormal; Notable for the following components:      Result Value   WBC 12.3 (*)    RBC 5.86 (*)    Hemoglobin 17.9 (*)    HCT 53.3 (*)    Platelets 604 (*)    All other components within normal limits  DIFFERENTIAL - Abnormal; Notable for the following components:   Neutro Abs 8.2 (*)    All other components within normal limits  COMPREHENSIVE METABOLIC PANEL WITH GFR - Abnormal; Notable for the following components:   Potassium 3.1 (*)    Creatinine, Ser 1.21 (*)    GFR, Estimated 53 (*)    All other components within normal limits  I-STAT CHEM 8, ED - Abnormal; Notable for the following components:   Potassium 3.1 (*)    Creatinine, Ser 1.30 (*)    Calcium , Ion 1.08 (*)    Hemoglobin 18.4 (*)    HCT 54.0 (*)    All other components within normal limits  ETHANOL  PROTIME-INR  APTT  RAPID URINE DRUG SCREEN, HOSP PERFORMED  CBG MONITORING, ED    EKG: None  Radiology: CT ANGIO HEAD NECK W WO CM W PERF (CODE STROKE) Result Date: 12/02/2023 CLINICAL DATA:  58 year old female code stroke presentation with left side deficits onset 0555 hours. EXAM: CT ANGIOGRAPHY HEAD AND NECK CT PERFUSION BRAIN TECHNIQUE: Multidetector CT imaging of the head and neck was performed using the standard protocol during bolus administration of intravenous contrast. Multiplanar CT image reconstructions and  MIPs were obtained to evaluate the vascular anatomy. Carotid stenosis measurements (when applicable) are obtained utilizing NASCET criteria, using the distal internal carotid diameter as the denominator. Multiphase CT imaging of the brain was performed following IV bolus contrast injection. Subsequent parametric perfusion maps were calculated using RAPID software. RADIATION DOSE REDUCTION: This exam was performed according to the departmental dose-optimization program which includes automated exposure control, adjustment of the mA and/or kV according to patient size and/or use of iterative reconstruction  technique. CONTRAST:  OMNIPAQUE  IOHEXOL  350 MG/ML SOLN COMPARISON:  Plain head CT 0705 hours today. FINDINGS: CT Brain Perfusion Findings: ASPECTS: 10 CBF (<30%) Volume: 0mL. No CBF or CBV parameter abnormality detected. Perfusion (Tmax>6.0s) volume: 0mL Mismatch Volume: Not applicable Infarction Location:Not applicable CTA NECK Skeleton: Absent dentition. Mild for age spine degeneration and no acute osseous abnormality identified. Upper chest: Symmetric dependent atelectasis with scattered subpleural scarring in the visible upper lungs. Negative visible superior mediastinum. Other neck: Nonvascular neck soft tissue spaces are within normal limits; probable symmetric lingual tonsil hypertrophy in the setting of previous tonsillectomy. Aortic arch: Calcified aortic atherosclerosis. Mildly bovine arch configuration. Right carotid system: Brachiocephalic origin calcified plaque without stenosis. Mild brachiocephalic and proximal right CCA tortuosity. Mild soft and calcified plaque at the right ICA origin and bulb. No stenosis. Left carotid system: Similar mild atherosclerosis and tortuosity. Mild to moderate proximal left ICA and left ICA bulb plaque with less than 50% stenosis. Vertebral arteries: Right subclavian origin calcified plaque without stenosis. Normal right vertebral artery origin. Right vertebral  artery is patent and normal to the skull base. Proximal left subclavian artery atherosclerosis without stenosis. Tortuous left subclavian at the thoracic inlet with a kinked appearance. Normal left vertebral artery origin. Tortuous left V1 segment. Mildly dominant left vertebral artery is patent and within normal limits to the skull base. CTA HEAD Posterior circulation: Distal vertebral arteries and vertebrobasilar junction are patent with no atherosclerosis or stenosis. Dominant left V4 segment. Patent PICA origins. Patent basilar artery with tortuosity. Patent SCA and right PCA origin. Fetal left PCA origin. Bilateral PCA branches are within normal limits. Anterior circulation: Both ICA siphons are patent. Left siphon mild to moderate calcified plaque without stenosis. Normal left posterior communicating artery origin. Right siphon minimal calcified plaque without stenosis. Patent carotid termini, normal MCA and ACA origins. Diminutive or absent anterior communicating artery. Bilateral ACA branches are within normal limits. Left MCA M1 segment and bifurcation are patent without stenosis. Left MCA branches are within normal limits. Right MCA M1 segment and trifurcation are patent without stenosis. Right MCA branches are within normal limits. Venous sinuses: Patent. Anatomic variants: Mildly dominant left vertebral artery. Fetal left PCA origin. Mildly bovine arch configuration. Review of the MIP images confirms the above findings IMPRESSION: 1. Negative CT Perfusion.  Negative for large vessel occlusion. 2. Generally mild for age atherosclerosis in the head and neck, most pronounced at the proximal Left ICA. No hemodynamically significant stenosis. 3.  Aortic Atherosclerosis (ICD10-I70.0). Electronically Signed   By: VEAR Hurst M.D.   On: 12/02/2023 08:47   CT HEAD CODE STROKE WO CONTRAST Addendum Date: 12/02/2023 ADDENDUM REPORT: 12/02/2023 07:22 ADDENDUM: Study discussed by telephone with Dr. LONNI SEATS  on 12/02/2023 at 0715 hours. Electronically Signed   By: VEAR Hurst M.D.   On: 12/02/2023 07:22   Result Date: 12/02/2023 CLINICAL DATA:  Code stroke. 58 year old female symptom onset 0555 hours. Left side deficits. EXAM: CT HEAD WITHOUT CONTRAST TECHNIQUE: Contiguous axial images were obtained from the base of the skull through the vertex without intravenous contrast. RADIATION DOSE REDUCTION: This exam was performed according to the departmental dose-optimization program which includes automated exposure control, adjustment of the mA and/or kV according to patient size and/or use of iterative reconstruction technique. COMPARISON:  Brain MRI 06/28/2023. FINDINGS: Brain: Stable cerebral volume. No midline shift, ventriculomegaly, mass effect, evidence of mass lesion, intracranial hemorrhage or evidence of cortically based acute infarction. Very age advanced, Patchy and confluent bilateral cerebral white  matter hypodensity with deep white matter capsule involvement. Direct involvement of the corpus callosum also. This appears stable from the March MRI. Comparatively mild deep gray nuclei heterogeneity appears stable, including chronic lacunar infarct or perivascular space of the right lentiform. Small chronic cerebellar infarcts better demonstrated by MRI. Vascular: No suspicious intracranial vascular hyperdensity. Calcified atherosclerosis at the skull base. Skull: Intact.  No acute osseous abnormality identified. Sinuses/Orbits: Visualized paranasal sinuses and mastoids are stable and well aerated. Other: No gaze deviation, acute orbit or scalp soft tissue finding. ASPECTS [facility] Stroke Program Early CT Score) Total score (0-10 with 10 being normal): 10 IMPRESSION: 1. No acute cortically based infarct or acute intracranial hemorrhage identified. ASPECTS 10. 2. 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 Electronically Signed: By: VEAR Hurst M.D. On: 12/02/2023  07:12    {Document cardiac monitor, telemetry assessment procedure when appropriate:32947} .Critical Care  Performed by: Pamella Ozell LABOR, DO Authorized by: Pamella Ozell LABOR, DO   Critical care provider statement:    Critical care time (minutes):  45   Critical care was necessary to treat or prevent imminent or life-threatening deterioration of the following conditions:  CNS failure or compromise   Critical care was time spent personally by me on the following activities:  Development of treatment plan with patient or surrogate, discussions with consultants, evaluation of patient's response to treatment, examination of patient, ordering and review of laboratory studies, ordering and review of radiographic studies, ordering and performing treatments and interventions, pulse oximetry, re-evaluation of patient's condition and review of old charts   I assumed direction of critical care for this patient from another provider in my specialty: no     Care discussed with: admitting provider   Comments:     Discussed with Dr.Gangloff    Medications Ordered in the ED  iohexol  (OMNIPAQUE ) 350 MG/ML injection 100 mL (100 mLs Intravenous Contrast Given 12/02/23 0812)  tenecteplase  (TNKASE ) injection for Stroke 17 mg (17 mg Intravenous Given 12/02/23 0724)    Clinical Course as of 12/02/23 0925  Tue Dec 02, 2023  0915 Reevaluated patient.  She reports improvement in the numbness of her left hand.  Now able to hold her left lower extremity in the air without drift.  Symmetric grip strength in the left upper extremities.  No headache or other complaints currently.  ICU bed has been assigned.  Awaiting transport to [place] [MP]    Clinical Course User Index [MP] Elizabet Schweppe A, DO   {Click here for ABCD2, HEART and other calculators REFRESH Note before signing:1}                              Medical Decision Making 58 year old female with history as above presenting for acute onset of stroke  symptoms.  Last known well time at 0555.  Left-sided deficits.  Initial stroke scale score of 3 as detailed in physical exam.  Examined with teleneurology team Dr.Gangloff.  No acute hemorrhage on CT without contrast of the head.  Considering persistent neurologic deficits and patient's clinical picture the decision was made to administer thrombolytics (TNK) after discussion with the patient.  TNK given at 0724.  Patient remained stable immediately thereafter.  We will bring her back to CT to obtain CTA head and neck.  Around 0740 patient reported to nursing staff that the numbness in the left upper extremity had improved suggesting a desired  effect of TNK.  She will be admitted to the neuro ICU under care of the neurology team for additional testing and monitoring post thrombolysis.  Risk Decision regarding hospitalization.     {Document critical care time when appropriate  Document review of labs and clinical decision tools ie CHADS2VASC2, etc  Document your independent review of radiology images and any outside records  Document your discussion with family members, caretakers and with consultants  Document social determinants of health affecting pt's care  Document your decision making why or why not admission, treatments were needed:32947:::1}   Final diagnoses:  Cerebral infarction, unspecified mechanism [facility])    ED Discharge Orders     None

## 2023-12-02 NOTE — ED Triage Notes (Signed)
 Patient presents from work for stroke-like symptoms that started at 0555. Patient presents with L side weakness, leaning towards L side, decreased sensation to the L side. ED provider at bedside. Stroke alert activated at 0650. Patient is alert and oriented

## 2023-12-02 NOTE — ED Notes (Signed)
 [facility] has been called for transport at this time

## 2023-12-02 NOTE — ED Notes (Addendum)
 Called CT to see if they are ready, informed it would be 15 minutes due to they have another patient at this time.

## 2023-12-02 NOTE — TOC CM/SW Note (Signed)
 Transition of Care [facility]) - Inpatient Brief Assessment   Patient Details  Name: Miranda Harvey MRN: [id number] Date of Birth: 1966-01-24  Transition of Care [facility]) CM/SW Contact:    Noreen KATHEE Pinal, LCSWA Phone Number: 12/02/2023, 9:51 AM   Clinical Narrative:   Transition of Care Department [facility]) has reviewed patient and no TOC needs have been identified at this time. We will continue to monitor patient advancement through interdisciplinary progression rounds. If new patient transition needs arise, please place a TOC consult.  Transition of Care Asessment: Insurance and Status: Insurance coverage has been reviewed Patient has primary care physician: Yes Home environment has been reviewed: Single Family Home Prior level of function:: Independent Prior/Current Home Services: No current home services Social Drivers of Health Review: SDOH reviewed interventions complete (Smoking information added to AVS) Readmission risk has been reviewed: Yes Transition of care needs: no transition of care needs at this time

## 2023-12-02 NOTE — H&P (Signed)
 NEUROLOGY H&P NOTE   Date of service: December 02, 2023 Patient Name: Miranda Harvey MRN:  [id number] DOB:  1965/05/19 Chief Complaint: Left-sided numbness   History of Present Illness  ARTRICE KRAKER is a 58 y.o. female with a PMHx of depression, anxiety, HTN, headaches and smoking who presented initially to the [facility] ED with acute onset of left-sided numbness and facial droop, in conjunction with an occipital headache. Patient reported that she was working third shift and noticed that suddenly the left side of her body and face became numb. A friend at work noticed a left-sided facial droop as well, and she then reported to the emergency department at [facility]. She was found to have left-sided weakness on exam in Triage and a Code Stroke was called. She was given TNK at 0724 and was subsequently transferred here for further care. She reports some recent chest tightness and fatigue but no other symptoms.  She states that she has had headaches for years, 2-3 of which have been associated with lateralized numbness and weakness that resolved spontaneously. She has not been diagnosed with migraines, stating only that she has a history of occasional headaches, which are associated with dry heaving at times, as well as photophobia and sonophobia. She states that her current headache is non-throbbing and located occipitally, but that other headaches in the past have been on either the right or left side of her head.   Last known well: 0555 Modified rankin score: 0 IV Thrombolysis: Yes, given at 0724 Thrombectomy: No; no LVO on CTA  NIHSS components Score: Comment  1a Level of Conscious 0[x]  1[]  2[]  3[]      1b LOC Questions 0[x]  1[]  2[]       1c LOC Commands 0[x]  1[]  2[]       2 Best Gaze 0[x]  1[]  2[]       3 Visual 0[x]  1[]  2[]  3[]      4 Facial Palsy 0[x]  1[]  2[]  3[]      5a Motor Arm - left 0[x]  1[]  2[]  3[]  4[]  UN[]    5b Motor Arm - Right 0[x]  1[]  2[]  3[]  4[]  UN[]    6a Motor Leg - Left 0[x]   1[]  2[]  3[]  4[]  UN[]    6b Motor Leg - Right 0[x]  1[]  2[]  3[]  4[]  UN[]    7 Limb Ataxia 0[x]  1[]  2[]  UN[]      8 Sensory 0[x]  1[]  2[]  UN[]      9 Best Language 0[x]  1[]  2[]  3[]      10 Dysarthria 0[x]  1[]  2[]  UN[]      11 Extinct. and Inattention 0[x]  1[]  2[]       TOTAL: 0       ROS  Comprehensive ROS performed and pertinent positives documented in the HPI  Past History   Past Medical History:  Diagnosis Date   Anxiety    Hypertension    Past Surgical History:  Procedure Laterality Date   BILATERAL SALPINGECTOMY Bilateral    LUMBAR LAMINECTOMY/DECOMPRESSION MICRODISCECTOMY Left 12/21/2020   Procedure: Microlumbar decompression Lumbar five sacral one  left;  Surgeon: Duwayne Purchase, MD;  Location: [facility] OR;  Service: Orthopedics;  Laterality: Left;   TONSILLECTOMY     Family History  Problem Relation Age of Onset   Diabetes Son    Cancer Maternal Grandfather    Cancer Cousin    Breast cancer Neg Hx    Social History   Socioeconomic History   Marital status: Married    Spouse name: Not on file   Number of children: Not on  file   Years of education: Not on file   Highest education level: Not on file  Occupational History   Not on file  Tobacco Use   Smoking status: Every Day    Current packs/day: 1.00    Average packs/day: 1 pack/day for 17.0 years (17.0 ttl pk-yrs)    Types: Cigarettes   Smokeless tobacco: Never  Vaping Use   Vaping status: Never Used  Substance and Sexual Activity   Alcohol use: No    Comment: rarely   Drug use: No   Sexual activity: Not on file  Other Topics Concern   Not on file  Social History Narrative   Not on file   Social Drivers of Health   Financial Resource Strain: Low Risk  (10/27/2023)   Received from [facility]   Overall Financial Resource Strain (CARDIA)    Difficulty of Paying Living Expenses: Not hard at all  Food Insecurity: No Food Insecurity (10/27/2023)   Received from [facility]   Hunger  Vital Sign    Within the past 12 months, you worried that your food would run out before you got the money to buy more.: Never true    Within the past 12 months, the food you bought just didn't last and you didn't have money to get more.: Never true  Transportation Needs: No Transportation Needs (10/27/2023)   Received from [facility] - Transportation    In the past 12 months, has lack of transportation kept you from medical appointments or from getting medications?: No    Lack of Transportation (Non-Medical): No  Physical Activity: Not on file  Stress: Not on file  Social Connections: Not on file   Allergies  Allergen Reactions   Bee Venom Anaphylaxis   Other Anaphylaxis    yarn   Penicillins Other (See Comments)    C-Diff    Medications   Current Facility-Administered Medications:    [START ON 12/03/2023]  stroke: early stages of recovery book, , Does not apply, Once, [employer], Cortney E, NP   0.9 %  sodium chloride  infusion, , Intravenous, Continuous, de La Torre, Cortney E, NP   acetaminophen  (TYLENOL ) tablet 650 mg, 650 mg, Oral, Q4H PRN **OR** acetaminophen  (TYLENOL ) 160 MG/5ML solution 650 mg, 650 mg, Per Tube, Q4H PRN **OR** acetaminophen  (TYLENOL ) suppository 650 mg, 650 mg, Rectal, Q4H PRN, de Clint Kill, Cortney E, NP   acetaminophen  (TYLENOL ) tablet 650 mg, 650 mg, Oral, Q6H PRN, Lehner, Erin C, NP   [START ON 12/03/2023] Chlorhexidine  Gluconate Cloth 2 % PADS 6 each, 6 each, Topical, Q0600, Rosemarie Eather RAMAN, MD, 6 each at 12/02/23 1220   [START ON 12/03/2023] escitalopram  (LEXAPRO ) tablet 5 mg, 5 mg, Oral, Daily, de Clint Kill, Cortney E, NP   Oral care mouth rinse, 15 mL, Mouth Rinse, PRN, Rosemarie Eather RAMAN, MD   pantoprazole  (PROTONIX ) injection 40 mg, 40 mg, Intravenous, QHS, de La Torre, [place] E, NP   [START ON 12/03/2023] pravastatin  (PRAVACHOL ) tablet 40 mg, 40 mg, Oral, Daily, de La Torre, Cortney E, NP   senna-docusate (Senokot-S) tablet 1  tablet, 1 tablet, Oral, QHS PRN, de Clint Kill, Cortney E, NP   [START ON 12/03/2023] venlafaxine  XR (EFFEXOR -XR) 24 hr capsule 75 mg, 75 mg, Oral, Q breakfast, de Clint Kill, Cortney E, NP   No current facility-administered medications on file prior to encounter.   Current Outpatient Medications on File Prior to Encounter  Medication  Sig Dispense Refill   amLODipine  (NORVASC ) 10 MG tablet Take 10 mg by mouth daily.     EPINEPHrine  0.3 mg/0.3 mL IJ SOAJ injection Inject 0.3 mg into the muscle as needed for anaphylaxis. 1 each 0   escitalopram  (LEXAPRO ) 10 MG tablet Take 5 mg by mouth daily.     hydrochlorothiazide (HYDRODIURIL) 25 MG tablet Take 25 mg by mouth daily.     pravastatin  (PRAVACHOL ) 40 MG tablet Take 40 mg by mouth daily.     traZODone (DESYREL) 100 MG tablet Take 100 mg by mouth at bedtime.     venlafaxine  XR (EFFEXOR -XR) 37.5 MG 24 hr capsule Take 75 mg by mouth daily with breakfast.     potassium chloride  SA (KLOR-CON  M) 20 MEQ tablet Take 1 tablet (20 mEq total) by mouth daily for 3 days. (Patient not taking: Reported on 12/02/2023) 3 tablet 0    Vitals   Vitals:   12/02/23 1330 12/02/23 1400 12/02/23 1430 12/02/23 1500  BP: 108/61 (!) 135/92 134/83 (!) 140/98  Pulse: (!) 56 (!) 59 64 66  Resp: 15 16 20 17   Temp:      TempSrc:      SpO2: 93% 95% 95% 95%  Weight:      Height:         Body mass index is 21.92 kg/m.  Physical Exam   Constitutional: Appears well-developed and well-nourished.  Psych: Affect appropriate to situation.  Eyes: No scleral injection.  HENT: No OP obstruction.  Head: Normocephalic.  Cardiovascular: Normal rate and regular rhythm.  Respiratory: Effort normal, non-labored breathing.  GI: Soft.  No distension. There is no tenderness.  Skin: WDI.   Neurologic Examination    Mental Status: AA&Ox3, able to give clear and coherent history of present illness Speech/Language: Speech is without dysarthria or aphasia.   Cranial Nerves:  II:  PERRL. Visual fields full.  III, IV, VI: EOMI. Eyelids elevate symmetrically.  V: Sensation is intact to light touch and symmetrical to face.  VII: Smile is symmetrical.  VIII: hearing intact to voice. IX, X: Phonation is normal.  KP:Dynloizm shrug 5/5. XII: tongue is midline without fasciculations. Motor: 5/5 strength to all muscle groups tested.  Tone is normal and bulk is normal Sensation- Intact to light touch bilaterally. No extinction with DSS.  Coordination: FTN and H-S intact bilaterally.  Gait- Deferred     Labs   CBC:  Recent Labs  Lab 12/02/23 0653 12/02/23 0658  WBC 12.3*  --   NEUTROABS 8.2*  --   HGB 17.9* 18.4*  HCT 53.3* 54.0*  MCV 91.0  --   PLT 604*  --    Basic Metabolic Panel:  Lab Results  Component Value Date   NA 139 12/02/2023   K 3.1 (L) 12/02/2023   CO2 25 12/02/2023   GLUCOSE 91 12/02/2023   BUN 17 12/02/2023   CREATININE 1.30 (H) 12/02/2023   CALCIUM  9.5 12/02/2023   GFRNONAA 53 (L) 12/02/2023   GFRAA >60 02/10/2017   Lipid Panel: No results found for: LDLCALC HgbA1c: No results found for: HGBA1C Urine Drug Screen: No results found for: LABOPIA, COCAINSCRNUR, LABBENZ, AMPHETMU, THCU, LABBARB  Alcohol Level     Component Value Date/Time   [facility] <15 12/02/2023 0653   INR  Lab Results  Component Value Date   INR 0.9 12/02/2023   APTT  Lab Results  Component Value Date   APTT 32 12/02/2023     CT Head without contrast (Personally reviewed): No  acute abnormality  CT angio Head and Neck with CTP: Negative CT Perfusion. Negative for large vessel occlusion. Generally mild for age atherosclerosis in the head and neck, most pronounced at the proximal Left ICA. No hemodynamically significant stenosis. Aortic atherosclerosis  MRI Brain: Pending  Assessment  MADDISEN VOUGHT is a 58 y.o. female with a PMHx of depression, anxiety, HTN, headaches and smoking who presented to the [facility] ED with sudden onset of  headache, left-sided numbness, left facial droop and left-sided weakness.  She was seen by Teleneurology and was given TNK to treat possible stroke.  She was then transferred here for further care.  Deficits have resolved completely since she has received the TNK and she reports no concerning symptoms at this time.  She states that she would like to be a full code at this time.  Will monitor in ICU for 24 hours and commence stroke workup. - Neurological exam is nonfocal. NIHSS 0 - Imaging as above - Impression: Acute right hemispheric stroke reversed with TNK versus complicated migraine. See HPI for a description of her prior headache history.   Plan  - Admit to ICU for post TNK monitoring - Keep blood pressure less than 180/105, use Cleviprex if necessary - Bleeding precautions, avoid sticks and tube insertions for 24 hours - Strict bedrest for 24 hours - MRI brain wo contrast - TTE w/ bubble - Check A1c and LDL + add statin per guidelines - No antiplatelet medications or anticoagulants for at least 24 hours following TNK. DVT prophylaxis with SCDs. - Continue her statin - Vital signs and NIHSS every 15 minutes x 2 hours, acute 30 minutes x 6 hours and hourly thereafter - STAT head CT for any change in neuro exam - Tele - PT/OT/SLP - Stroke education - Amb referral to neurology upon discharge   ______________________________________________________________________ Patient seen by NP and by MD. Signed, Cortney E Everitt Clint Kill, NP [facility]  I have seen and examined the patient. I have formulated the assessment and plan. 58 y.o. female with a PMHx of depression, anxiety, HTN, headaches and smoking who presented to the [facility] ED with sudden onset of headache, left-sided numbness, left facial droop and left-sided weakness.  She was seen by Teleneurology and was given TNK to treat possible stroke.  She was then transferred here for further care.  Deficits have resolved  completely since she has received the TNK and she reports no concerning symptoms at this time. Neurological exam is nonfocal; NIHSS 0. Plan as documented above.  Electronically signed: Dr. Mirca Yale

## 2023-12-02 NOTE — ED Notes (Signed)
 Patient stated The numbness in left arm has went away. MD made aware.

## 2023-12-02 NOTE — ED Notes (Signed)
 Patient given first saline, then TNK and second Normal saline at 0724 with neurologist on neuro tele cart.

## 2023-12-02 NOTE — ED Notes (Signed)
 Patient transported to CT

## 2023-12-02 NOTE — Consult Note (Addendum)
 [facility] Tele[facility]urology Consult Services   Patient Name:   Miranda, Harvey Date of Birth:   Sep 25, 1965 Date of Service:   12/02/2023 06:53:24  Diagnosis:       I63.89 - Cerebrovascular accident (CVA) due to other mechanism [facility])  Impression:      This consult was conducted in real-time using interactive audio and video technology. Patient was informed of the technology being used for this visit and agreed to proceed. Patient located in hospital and provider located at home/office setting.    This is a 58 year old F with HTN, multiple remote lacunar CVAs who is not on preventive stroke medication, who [facility] [place] [facility] [address] [place] at 12/02/2023 06:36:00 for complaints of left numbness, left weakness, gait instability.    She works the night shift. She was at work and normal, until her symptoms started suddently at 5:55 AM, about 1.5 hours ago. Upon arrival, the patient was found to have left-sided weakness, leaning towards the left side, and decreased sensation on the left side of the body.    On my examination, the patient demonstrated difficulty lifting the left leg. Furthermore, the patient reported that her walking was impaired, causing instability and veering to the left side.    On physical exam, the NIHSS is : 4.    Left sided weakness and gait instability is highly suggestive of stroke, and indeed per her MRI in March 2025 she has had multiple strokes before, and is not on preventive medication, so her risk of such is high. CTH was done stat and was without hemorrhage. I had a discussion with the patient regarding thrombolytics (tPA/TNK). She confirmed no contraindications to thrombolytics. The risks of thrombolytics, including possible hemorrhage and death, and the possible benefits were discussed. She demonstrated good decision making capacity. She confirmed that she feels her symptoms are disabling to her. She expressed understanding  and consented to administer the thrombolytic. TNK was administered at 12/02/2023 07:24:28. She was then sent back stat for CTA head and neck with contrast. Recs below.  Our recommendations are outlined below. Recommendations: IV Tenecteplase  recommended.  I confirmed the following. (Patient name, DOB, MRN, Blood Pressure, dose of Thrombolytic and waste, weight completed by stretcher/scale not stated weight, have ED staff inform ED MD of thrombolytic decision) Thrombolytic bolus given Without Complication.  IV Tenecteplase  Total Dose - 17.0 mg (Dose Rounding Per Facility Protocol)  Routine post Thrombolytic monitoring including neuro checks and blood pressure control during/after treatment Monitor blood pressure Check blood pressure and neuro assessment every 15 min for 2 h, then every 30 min for 6 h, and finally every hour for 16 h.  Manage Blood Pressure per post Thrombolytic protocol.        Follow designated hospital protocol for admission and post thrombolytic care       CT brain 24 hours post Thrombolytic       NPO until swallowing screen performed and passed       No antiplatelet agents or anticoagulants (including heparin for DVT prophylaxis) in first 24 hours       No Foley catheter, nasogastric tube, arterial catheter or central venous catheter for 24 hr, unless absolutely necessary       Telemetry       Bedside swallow evaluation       HOB less than 30 degrees       Euglycemia       Avoid hyperthermia, PRN acetaminophen   DVT prophylaxis       Inpatient Neurology Consultation       Stroke evaluation as per inpatient neurology recommendations       MRI brain without contrast (routine, diagnostic)       TTE with bubble  Discussed with ED physician    ------------------------------------------------------------------------------  Advanced Imaging: CTA: An acute surgically intervenable large vessel occlusion was not seen, therefore patient is not an NIR IA  candidate.  Metrics: Last Known Well: 12/02/2023 05:55:00 Dispatch Time: 12/02/2023 06:53:24 Arrival Time: 12/02/2023 06:36:00 Initial Response Time: 12/02/2023 06:58:43 Symptoms: left numbness, left weakness, gait instability. Initial patient interaction: 12/02/2023 07:06:01 NIHSS Assessment Completed: 12/02/2023 07:11:20 Patient is a candidate for Thrombolytic. Thrombolytic Medical Decision: 12/02/2023 07:12:00 Needle Time: 12/02/2023 92:75:71 Weight Noted by Staff: 67.9 kg  CT Head: I personally reviewed all the CT images that were available to me and it showed: remote lacunar CVAs seen, stable from MRI in March  Primary Provider Notified of Diagnostic Impression and Management Plan on: 12/02/2023 07:23:40    ------------------------------------------------------------------------------  Thrombolytic Contraindications:  Last Known Well > 4.5 hours: No CT Head showing hemorrhage: No Ischemic stroke within 3 months: No Severe head trauma within 3 months: No Intracranial/intraspinal surgery within 3 months: No History of intracranial hemorrhage: No Symptoms and signs consistent with an SAH: No GI malignancy or GI bleed within 21 days: No Coagulopathy: Platelets <100 000 /mm3, INR >1.7, aPTT>40 s, or PT >15 s: No Treatment dose of LMWH within the previous 24 hrs: No Use of NOACs in past 48 hours: No Glycoprotein IIb/IIIa receptor inhibitors use: No Symptoms consistent with infective endocarditis: No Suspected aortic arch dissection: No Intra-axial intracranial neoplasm: No  Thrombolytic Decision and Management Plan: Management with thrombolytic treatment was explained to the Patient as was risks and benefits and alternatives to the treatment. Patient agrees with the decision to proceed with thrombolytic treatment. . All questions were answered and the Patient expressed understanding of the treatment plan.   History of Present Illness: Patient is a 58 year old  Female.  Patient was brought by private transportation with symptoms of left numbness, left weakness, gait instability. This consult was conducted in real-time using interactive audio and video technology. Patient was informed of the technology being used for this visit and agreed to proceed. Patient located in hospital and provider located at home/office setting.  This is a 58 year old F with HTN, multiple remote lacunar CVAs who is not on preventive stroke medication, who presents to [facility]- [address], [place] at 12/02/2023 06:36:00 for complaints of left numbness, left weakness, gait instability.  She works the night shift. She was at work and normal, until her symptoms started suddently at 5:55 AM, about 1.5 hours ago. Upon arrival, the patient was found to have left-sided weakness, leaning towards the left side, and decreased sensation on the left side of the body.  On my examination, the patient demonstrated difficulty lifting the left leg. Furthermore, the patient reported that her walking was impaired, causing instability and veering to the left side.  On physical exam, the NIHSS is : 4.   Past Medical History: Other PMH:  HTN, Remote lacunar CVAs  Medications:  No Anticoagulant use  No Antiplatelet use Reviewed EMR for current medications  Allergies:  Reviewed  Social History: Drug Use: No  Family History:  There is no family history of premature cerebrovascular disease pertinent to this consultation  ROS : 14 Points Review of Systems was performed and  was negative except mentioned in HPI.  Past Surgical History: There Is No Surgical History Contributory To Today's Visit   Examination: BP(127/87), Pulse(75), Blood Glucose(150) 1A: Level of Consciousness - Alert; keenly responsive + 0 1B: Ask Month and Age - 1 Question Right + 1 1C: Blink Eyes & Squeeze Hands - Performs Both Tasks + 0 2: Test Horizontal Extraocular Movements - Normal + 0 3:  Test Visual Fields - No Visual Loss + 0 4: Test Facial Palsy (Use Grimace if Obtunded) - Normal symmetry + 0 5A: Test Left Arm Motor Drift - No Drift for 10 Seconds + 0 5B: Test Right Arm Motor Drift - No Drift for 10 Seconds + 0 6A: Test Left Leg Motor Drift - Drift, but doesn't hit bed + 1 6B: Test Right Leg Motor Drift - No Drift for 5 Seconds + 0 7: Test Limb Ataxia (FNF/Heel-Shin) - No Ataxia + 0 8: Test Sensation - Mild-Moderate Loss: Less Sharp/More Dull + 1 9: Test Language/Aphasia - Normal; No aphasia + 0 10: Test Dysarthria - Normal + 0 11: Test Extinction/Inattention - No abnormality + 0  NIHSS Score: 3 NIHSS Free Text : Couldnt remember the month.  Pre-Morbid Modified Rankin Scale: 0 Points = No symptoms at all  Spoke with : ed provider at bedside, and again by phone I reviewed the available imaging via Rapid and initiated discussion with the primary provider  This consult was conducted in real time using interactive audio and Immunologist. Patient was informed of the technology being used for this visit and agreed to proceed. Patient located in hospital and provider located at home/office setting.   Patient is being evaluated for possible acute neurologic impairment and high probability of imminent or life-threatening deterioration. I spent total of 55 minutes providing care to this patient, including time for face to face visit via telemedicine, review of medical records, imaging studies and discussion of findings with providers, the patient and/or family.     Dr Elspeth Narrow   [facility] For Inpatient follow-up with [facility] physician please call [facility] at [phone]. As we are not an outpatient service for any post hospital discharge needs please contact the hospital for assistance.  If you have any questions for the [facility] physicians or need to reconsult for clinical or diagnostic changes please contact us  via [facility] at  [phone].   Signature : Elspeth Narrow

## 2023-12-03 ENCOUNTER — Inpatient Hospital Stay (HOSPITAL_COMMUNITY)

## 2023-12-03 ENCOUNTER — Other Ambulatory Visit (HOSPITAL_COMMUNITY): Payer: Self-pay

## 2023-12-03 DIAGNOSIS — I639 Cerebral infarction, unspecified: Secondary | ICD-10-CM | POA: Diagnosis not present

## 2023-12-03 DIAGNOSIS — I10 Essential (primary) hypertension: Secondary | ICD-10-CM | POA: Diagnosis not present

## 2023-12-03 DIAGNOSIS — R29700 NIHSS score 0: Secondary | ICD-10-CM | POA: Diagnosis not present

## 2023-12-03 DIAGNOSIS — G43109 Migraine with aura, not intractable, without status migrainosus: Principal | ICD-10-CM | POA: Insufficient documentation

## 2023-12-03 DIAGNOSIS — R2990 Unspecified symptoms and signs involving the nervous system: Secondary | ICD-10-CM | POA: Insufficient documentation

## 2023-12-03 LAB — CBC
HCT: 50 % — ABNORMAL HIGH (ref 36.0–46.0)
Hemoglobin: 16.6 g/dL — ABNORMAL HIGH (ref 12.0–15.0)
MCH: 29.8 pg (ref 26.0–34.0)
MCHC: 33.2 g/dL (ref 30.0–36.0)
MCV: 89.8 fL (ref 80.0–100.0)
Platelets: 469 K/uL — ABNORMAL HIGH (ref 150–400)
RBC: 5.57 MIL/uL — ABNORMAL HIGH (ref 3.87–5.11)
RDW: 15 % (ref 11.5–15.5)
WBC: 8.8 K/uL (ref 4.0–10.5)
nRBC: 0 % (ref 0.0–0.2)

## 2023-12-03 LAB — HEMOGLOBIN A1C
Hgb A1c MFr Bld: 5.5 % (ref 4.8–5.6)
Mean Plasma Glucose: 111 mg/dL

## 2023-12-03 LAB — COMPREHENSIVE METABOLIC PANEL WITH GFR
ALT: 10 U/L (ref 0–44)
AST: 15 U/L (ref 15–41)
Albumin: 3.5 g/dL (ref 3.5–5.0)
Alkaline Phosphatase: 74 U/L (ref 38–126)
Anion gap: 11 (ref 5–15)
BUN: 10 mg/dL (ref 6–20)
CO2: 28 mmol/L (ref 22–32)
Calcium: 9.6 mg/dL (ref 8.9–10.3)
Chloride: 103 mmol/L (ref 98–111)
Creatinine, Ser: 0.88 mg/dL (ref 0.44–1.00)
GFR, Estimated: >60 mL/min (ref >60)
Glucose, Bld: 97 mg/dL (ref 70–99)
Potassium: 3.9 mmol/L (ref 3.5–5.1)
Sodium: 142 mmol/L (ref 135–145)
Total Bilirubin: 0.4 mg/dL (ref 0.0–1.2)
Total Protein: 6.3 g/dL — ABNORMAL LOW (ref 6.5–8.1)

## 2023-12-03 LAB — LIPID PANEL
Cholesterol: 175 mg/dL (ref 0–200)
HDL: 58 mg/dL (ref >40)
LDL Cholesterol: 104 mg/dL — ABNORMAL HIGH (ref 0–99)
Total CHOL/HDL Ratio: 3 ratio
Triglycerides: 67 mg/dL (ref <150)
VLDL: 13 mg/dL (ref 0–40)

## 2023-12-03 LAB — HIV ANTIBODY (ROUTINE TESTING W REFLEX): HIV Screen 4th Generation wRfx: NONREACTIVE

## 2023-12-03 MED ORDER — ASPIRIN 81 MG PO TBEC
81.0000 mg | DELAYED_RELEASE_TABLET | Freq: Every day | ORAL | Status: DC
  Administered 2023-12-03 (×2): 81 mg via ORAL
  Filled 2023-12-03: qty 1

## 2023-12-03 MED ORDER — ROSUVASTATIN CALCIUM 20 MG PO TABS
20.0000 mg | ORAL_TABLET | Freq: Every day | ORAL | 0 refills | Status: AC
  Filled 2023-12-03: qty 30, 30d supply, fill #0

## 2023-12-03 MED ORDER — ROSUVASTATIN CALCIUM 20 MG PO TABS
20.0000 mg | ORAL_TABLET | Freq: Every day | ORAL | Status: DC
  Administered 2023-12-03 (×2): 20 mg via ORAL
  Filled 2023-12-03: qty 1

## 2023-12-03 MED ORDER — ASPIRIN 81 MG PO TBEC
81.0000 mg | DELAYED_RELEASE_TABLET | Freq: Every day | ORAL | 12 refills | Status: AC
  Filled 2023-12-03: qty 30, 30d supply, fill #0

## 2023-12-03 NOTE — Plan of Care (Signed)
  Problem: Clinical Measurements: Goal: Ability to maintain clinical measurements within normal limits will improve Outcome: Progressing   Problem: Activity: Goal: Risk for activity intolerance will decrease Outcome: Progressing   Problem: Nutrition: Goal: Adequate nutrition will be maintained Outcome: Progressing   Problem: Coping: Goal: Level of anxiety will decrease Outcome: Progressing   Problem: Elimination: Goal: Will not experience complications related to urinary retention Outcome: Progressing   Problem: Pain Managment: Goal: General experience of comfort will improve and/or be controlled Outcome: Progressing   Problem: Ischemic Stroke/TIA Tissue Perfusion: Goal: Complications of ischemic stroke/TIA will be minimized Outcome: Progressing   Problem: Coping: Goal: Will identify appropriate support needs Outcome: Progressing   Problem: Health Behavior/Discharge Planning: Goal: Goals will be collaboratively established with patient/family Outcome: Progressing   Problem: Self-Care: Goal: Ability to participate in self-care as condition permits will improve Outcome: Progressing Goal: Ability to communicate needs accurately will improve Outcome: Progressing   Problem: Nutrition: Goal: Dietary intake will improve Outcome: Progressing

## 2023-12-03 NOTE — Progress Notes (Signed)
   Echocardiogram 2D Echocardiogram has been performed.  Miranda Harvey 12/03/2023, 3:22 PM

## 2023-12-03 NOTE — Evaluation (Signed)
 Occupational Therapy Evaluation Patient Details Name: Miranda Harvey MRN: [id number] DOB: [birth year] Today's Date: 12/03/2023   History of Present Illness   Miranda Harvey is a 58 y.o. female  who presented initially to the [facility] ED with acute onset of left-sided numbness and facial droop, in conjunction with an occipital headache. Received TNK at 724am on August 11th. PMHx of depression, anxiety, HTN, headaches and smoking     Clinical Impressions Pt ind at baseline,  working, driving, lives with spouse who can provide PRN assist. Pt currently performing ADLs without assist, independently mobilizing. Pt with some memory deficits noted with administration of SBT, but per pt/spouse, pt with baseline memory deficits. Pt presenting with impairments listed below, however has no further OT needs at this time, will s/o. Please reconsult if there is a change in pt status. Anticipate no OT follow up needs at d/c.      If plan is discharge home, recommend the following:   Assist for transportation;Assistance with cooking/housework;Supervision due to cognitive status     Functional Status Assessment   Patient has had a recent decline in their functional status and demonstrates the ability to make significant improvements in function in a reasonable and predictable amount of time.     Equipment Recommendations   None recommended by OT     Recommendations for Other Services   PT consult     Precautions/Restrictions   Precautions Precautions: None Restrictions Weight Bearing Restrictions Per Provider Order: No     Mobility Bed Mobility               General bed mobility comments: OOB in chair upon arrival and departure    Transfers Overall transfer level: Needs assistance Equipment used: None Transfers: Sit to/from Stand Sit to Stand: Supervision                  Balance Overall balance assessment: No apparent balance deficits (not formally  assessed)                                         ADL either performed or assessed with clinical judgement   ADL Overall ADL's : At baseline                                       General ADL Comments: pt performing simulated ADLs without assist     Vision   Vision Assessment?: No apparent visual deficits     Perception Perception: Not tested       Praxis Praxis: Not tested       Pertinent Vitals/Pain Pain Assessment Pain Assessment: No/denies pain     Extremity/Trunk Assessment Upper Extremity Assessment Upper Extremity Assessment: Overall WFL for tasks assessed;Right hand dominant   Lower Extremity Assessment Lower Extremity Assessment: Defer to PT evaluation   Cervical / Trunk Assessment Cervical / Trunk Assessment: Normal   Communication Communication Communication: No apparent difficulties   Cognition Arousal: Alert Behavior During Therapy: WFL for tasks assessed/performed Cognition: Cognition impaired       Memory impairment (select all impairments): Short-term memory, Working memory     OT - Cognition Comments: pt unable to recall address after ~2 mins and  unable to sequence to state months of year backward, per pt/spouse pt with baseline memory deficits  Following commands: Intact       Cueing  General Comments   Cueing Techniques: Verbal cues  VSS on RA   Exercises     Shoulder Instructions      Home Living Family/patient expects to be discharged to:: Private residence Living Arrangements: Spouse/significant other Available Help at Discharge: Family;Available 24 hours/day Type of Home: Mobile home Home Access: Stairs to enter Entrance Stairs-Number of Steps: 7 Entrance Stairs-Rails: Can reach both Home Layout: One level     Bathroom Shower/Tub: Producer, television/film/video: Standard Bathroom Accessibility: Yes   Home Equipment: Shower seat          Prior  Functioning/Environment Prior Level of Function : Independent/Modified Independent;Working/employed;Driving             Mobility Comments: no AD ADLs Comments: indep, works in [employer], corporate Problem List: Decreased cognition   OT Treatment/Interventions:        OT Goals(Current goals can be found in the care plan section)   Acute Rehab OT Goals Patient Stated Goal: none stated OT Goal Formulation: With patient Time For Goal Achievement: 12/17/23 Potential to Achieve Goals: Good   OT Frequency:       Co-evaluation              AM-PAC OT 6 Clicks Daily Activity     Outcome Measure Help from another person eating meals?: None Help from another person taking care of personal grooming?: None Help from another person toileting, which includes using toliet, bedpan, or urinal?: None Help from another person bathing (including washing, rinsing, drying)?: None Help from another person to put on and taking off regular upper body clothing?: None Help from another person to put on and taking off regular lower body clothing?: None 6 Click Score: 24   End of Session Nurse Communication: Mobility status  Activity Tolerance: Patient tolerated treatment well Patient left: in chair;with call bell/phone within reach;with family/visitor present;with nursing/sitter in room (MD present)  OT Visit Diagnosis: Unsteadiness on feet (R26.81);Other abnormalities of gait and mobility (R26.89);Muscle weakness (generalized) (M62.81)                Time: 9087-9074 OT Time Calculation (min): 13 min Charges:  OT General Charges $OT Visit: 1 Visit OT Evaluation $OT Eval Low Complexity: 1 Low  [name], OTD, OTR/L SecureChat Preferred Acute Rehab [phone]   [name] 12/03/2023, 9:51 AM

## 2023-12-03 NOTE — Discharge Summary (Addendum)
 Stroke Discharge Summary  Patient ID: Miranda Harvey   MRN: [id number]      DOB: July 28, 1965  Date of Admission: 12/02/2023 Date of Discharge: 12/03/2023  Attending Physician:  Stroke, Md, MD Consultant(s):    None  Patient's PCP:  Sherial Bail, MD  DISCHARGE PRIMARY DIAGNOSIS: Stroke like symptoms -left hemisensory loss aborted by TNK likely complicated Migraine    Patient Active Problem List   Diagnosis Date Noted   Stroke-like symptoms 12/03/2023   Complicated migraine 12/03/2023   Ischemic stroke (HCC) 12/02/2023   Stroke (cerebrum) (HCC) 12/02/2023   HNP (herniated nucleus pulposus), lumbar 12/21/2020   Bilateral carotid artery stenosis 10/04/2016   Pure hypercholesterolemia 10/04/2016   LVH (left ventricular hypertrophy) due to hypertensive disease, without heart failure 09/05/2016   Abnormal EKG 08/08/2015   Current smoker 08/08/2015   Depression with anxiety 08/08/2015   Essential hypertension 08/08/2015     Allergies as of 12/03/2023       Reactions   Bee Venom Anaphylaxis   Other Anaphylaxis   yarn   Penicillins Other (See Comments)   C-Diff        Medication List     STOP taking these medications    pravastatin  40 MG tablet Commonly known as: PRAVACHOL        TAKE these medications    amLODipine  10 MG tablet Commonly known as: NORVASC  Take 10 mg by mouth daily.   aspirin  EC 81 MG tablet Take 1 tablet (81 mg total) by mouth daily. Swallow whole. Start taking on: December 04, 2023   EPINEPHrine  0.3 mg/0.3 mL Soaj injection Commonly known as: EPI-PEN Inject 0.3 mg into the muscle as needed for anaphylaxis.   escitalopram  10 MG tablet Commonly known as: LEXAPRO  Take 5 mg by mouth daily.   hydrochlorothiazide 25 MG tablet Commonly known as: HYDRODIURIL Take 25 mg by mouth daily.   potassium chloride  SA 20 MEQ tablet Commonly known as: KLOR-CON  M Take 1 tablet (20 mEq total) by mouth daily for 3 days.   rosuvastatin  20 MG  tablet Commonly known as: CRESTOR  Take 1 tablet (20 mg total) by mouth daily. Start taking on: December 04, 2023   traZODone 100 MG tablet Commonly known as: DESYREL Take 100 mg by mouth at bedtime.   venlafaxine  XR 37.5 MG 24 hr capsule Commonly known as: EFFEXOR -XR Take 75 mg by mouth daily with breakfast.        LABORATORY STUDIES CBC    Component Value Date/Time   WBC 8.8 12/03/2023 0444   RBC 5.57 (H) 12/03/2023 0444   HGB 16.6 (H) 12/03/2023 0444   HCT 50.0 (H) 12/03/2023 0444   PLT 469 (H) 12/03/2023 0444   MCV 89.8 12/03/2023 0444   MCH 29.8 12/03/2023 0444   MCHC 33.2 12/03/2023 0444   RDW 15.0 12/03/2023 0444   LYMPHSABS 2.7 12/02/2023 0653   MONOABS 0.9 12/02/2023 0653   EOSABS 0.4 12/02/2023 0653   BASOSABS 0.1 12/02/2023 0653   CMP    Component Value Date/Time   NA 142 12/03/2023 0444   K 3.9 12/03/2023 0444   CL 103 12/03/2023 0444   CO2 28 12/03/2023 0444   GLUCOSE 97 12/03/2023 0444   BUN 10 12/03/2023 0444   CREATININE 0.88 12/03/2023 0444   CALCIUM  9.6 12/03/2023 0444   PROT 6.3 (L) 12/03/2023 0444   ALBUMIN 3.5 12/03/2023 0444   AST 15 12/03/2023 0444   ALT 10 12/03/2023 0444   ALKPHOS 74 12/03/2023 0444  BILITOT 0.4 12/03/2023 0444   GFRNONAA >60 12/03/2023 0444   GFRAA >60 02/10/2017 0737   COAGS Lab Results  Component Value Date   INR 0.9 12/02/2023   Lipid Panel    Component Value Date/Time   CHOL 175 12/03/2023 0444   TRIG 67 12/03/2023 0444   HDL 58 12/03/2023 0444   CHOLHDL 3.0 12/03/2023 0444   VLDL 13 12/03/2023 0444   LDLCALC 104 (H) 12/03/2023 0444   HgbA1C No results found for: HGBA1C Alcohol Level    Component Value Date/Time   [facility] <15 12/02/2023 0653     SIGNIFICANT DIAGNOSTIC STUDIES MR BRAIN WO CONTRAST Result Date: 12/03/2023 CLINICAL DATA:  58 year old female code stroke presentation yesterday. The left side numbness. EXAM: MRI HEAD WITHOUT CONTRAST TECHNIQUE: Multiplanar, multiecho pulse sequences  of the brain and surrounding structures were obtained without intravenous contrast. COMPARISON:  CT head, CTA and CTP yesterday. Brain MRI 06/28/2023. FINDINGS: Brain: No restricted diffusion to suggest acute infarction. No midline shift, mass effect, evidence of mass lesion, ventriculomegaly, extra-axial collection or acute intracranial hemorrhage. Cervicomedullary junction and pituitary are within normal limits. Chronically advanced cerebral white matter disease, patchy and confluent T2 and FLAIR hyperintensity in the bilateral deep and periventricular white matter, areas of cystic white matter encephalomalacia bordering the body of the corpus callosum, and chronic corpus callosum body volume loss. There is a superimposed small area of chronic cortical encephalomalacia left parietal lobe sensory or post sensory strip series 11, image 43. No other cortical encephalomalacia identified. Moderate T2 heterogeneity in the bilateral deep gray nuclei, with occasional evidence of small chronic deep gray lacunar infarcts. No chronic cerebral blood products identified on SWI. Brainstem is within normal limits. Multiple small bilateral chronic cerebellar lacunar type infarcts (series 10, image 7). Vascular: Major intracranial vascular flow voids are stable from the previous MRI. Skull and upper cervical spine: Negative. Visualized bone marrow signal is within normal limits. Sinuses/Orbits: Stable and negative. Other: Mastoids remain clear.  Negative visible scalp and face. IMPRESSION: 1. No acute intracranial abnormality. 2. Severe chronic signal changes most suggestive of advanced chronic small vessel disease. Extensive cerebral white matter involvement. Small chronic cortically based infarct in the left parietal lobe. Bilateral cerebellar and occasional deep gray nuclei involvement. Electronically Signed   By: VEAR Hurst M.D.   On: 12/03/2023 06:51   CT ANGIO HEAD NECK W WO CM W PERF (CODE STROKE) Result Date:  12/02/2023 CLINICAL DATA:  58 year old female code stroke presentation with left side deficits onset 0555 hours. EXAM: CT ANGIOGRAPHY HEAD AND NECK CT PERFUSION BRAIN TECHNIQUE: Multidetector CT imaging of the head and neck was performed using the standard protocol during bolus administration of intravenous contrast. Multiplanar CT image reconstructions and MIPs were obtained to evaluate the vascular anatomy. Carotid stenosis measurements (when applicable) are obtained utilizing NASCET criteria, using the distal internal carotid diameter as the denominator. Multiphase CT imaging of the brain was performed following IV bolus contrast injection. Subsequent parametric perfusion maps were calculated using RAPID software. RADIATION DOSE REDUCTION: This exam was performed according to the departmental dose-optimization program which includes automated exposure control, adjustment of the mA and/or kV according to patient size and/or use of iterative reconstruction technique. CONTRAST:  OMNIPAQUE  IOHEXOL  350 MG/ML SOLN COMPARISON:  Plain head CT 0705 hours today. FINDINGS: CT Brain Perfusion Findings: ASPECTS: 10 CBF (<30%) Volume: 0mL. No CBF or CBV parameter abnormality detected. Perfusion (Tmax>6.0s) volume: 0mL Mismatch Volume: Not applicable Infarction Location:Not applicable CTA NECK Skeleton: Absent dentition.  Mild for age spine degeneration and no acute osseous abnormality identified. Upper chest: Symmetric dependent atelectasis with scattered subpleural scarring in the visible upper lungs. Negative visible superior mediastinum. Other neck: Nonvascular neck soft tissue spaces are within normal limits; probable symmetric lingual tonsil hypertrophy in the setting of previous tonsillectomy. Aortic arch: Calcified aortic atherosclerosis. Mildly bovine arch configuration. Right carotid system: Brachiocephalic origin calcified plaque without stenosis. Mild brachiocephalic and proximal right CCA tortuosity. Mild soft  and calcified plaque at the right ICA origin and bulb. No stenosis. Left carotid system: Similar mild atherosclerosis and tortuosity. Mild to moderate proximal left ICA and left ICA bulb plaque with less than 50% stenosis. Vertebral arteries: Right subclavian origin calcified plaque without stenosis. Normal right vertebral artery origin. Right vertebral artery is patent and normal to the skull base. Proximal left subclavian artery atherosclerosis without stenosis. Tortuous left subclavian at the thoracic inlet with a kinked appearance. Normal left vertebral artery origin. Tortuous left V1 segment. Mildly dominant left vertebral artery is patent and within normal limits to the skull base. CTA HEAD Posterior circulation: Distal vertebral arteries and vertebrobasilar junction are patent with no atherosclerosis or stenosis. Dominant left V4 segment. Patent PICA origins. Patent basilar artery with tortuosity. Patent SCA and right PCA origin. Fetal left PCA origin. Bilateral PCA branches are within normal limits. Anterior circulation: Both ICA siphons are patent. Left siphon mild to moderate calcified plaque without stenosis. Normal left posterior communicating artery origin. Right siphon minimal calcified plaque without stenosis. Patent carotid termini, normal MCA and ACA origins. Diminutive or absent anterior communicating artery. Bilateral ACA branches are within normal limits. Left MCA M1 segment and bifurcation are patent without stenosis. Left MCA branches are within normal limits. Right MCA M1 segment and trifurcation are patent without stenosis. Right MCA branches are within normal limits. Venous sinuses: Patent. Anatomic variants: Mildly dominant left vertebral artery. Fetal left PCA origin. Mildly bovine arch configuration. Review of the MIP images confirms the above findings IMPRESSION: 1. Negative CT Perfusion.  Negative for large vessel occlusion. 2. Generally mild for age atherosclerosis in the head and  neck, most pronounced at the proximal Left ICA. No hemodynamically significant stenosis. 3.  Aortic Atherosclerosis (ICD10-I70.0). Electronically Signed   By: VEAR Hurst M.D.   On: 12/02/2023 08:47   CT HEAD CODE STROKE WO CONTRAST Addendum Date: 12/02/2023 ADDENDUM REPORT: 12/02/2023 07:22 ADDENDUM: Study discussed by telephone with Dr. LONNI SEATS on 12/02/2023 at 0715 hours. Electronically Signed   By: VEAR Hurst M.D.   On: 12/02/2023 07:22   Result Date: 12/02/2023 CLINICAL DATA:  Code stroke. 58 year old female symptom onset 0555 hours. Left side deficits. EXAM: CT HEAD WITHOUT CONTRAST TECHNIQUE: Contiguous axial images were obtained from the base of the skull through the vertex without intravenous contrast. RADIATION DOSE REDUCTION: This exam was performed according to the departmental dose-optimization program which includes automated exposure control, adjustment of the mA and/or kV according to patient size and/or use of iterative reconstruction technique. COMPARISON:  Brain MRI 06/28/2023. FINDINGS: Brain: Stable cerebral volume. No midline shift, ventriculomegaly, mass effect, evidence of mass lesion, intracranial hemorrhage or evidence of cortically based acute infarction. Very age advanced, Patchy and confluent bilateral cerebral white matter hypodensity with deep white matter capsule involvement. Direct involvement of the corpus callosum also. This appears stable from the March MRI. Comparatively mild deep gray nuclei heterogeneity appears stable, including chronic lacunar infarct or perivascular space of the right lentiform. Small chronic cerebellar infarcts better demonstrated by MRI. Vascular: No  suspicious intracranial vascular hyperdensity. Calcified atherosclerosis at the skull base. Skull: Intact.  No acute osseous abnormality identified. Sinuses/Orbits: Visualized paranasal sinuses and mastoids are stable and well aerated. Other: No gaze deviation, acute orbit or scalp soft tissue  finding. ASPECTS [facility] Stroke Program Early CT Score) Total score (0-10 with 10 being normal): 10 IMPRESSION: 1. No acute cortically based infarct or acute intracranial hemorrhage identified. ASPECTS 10. 2. 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 Electronically Signed: By: VEAR Hurst M.D. On: 12/02/2023 07:12   MM 3D SCREENING MAMMOGRAM BILATERAL BREAST Result Date: 11/18/2023 CLINICAL DATA:  Screening. EXAM: DIGITAL SCREENING BILATERAL MAMMOGRAM WITH TOMOSYNTHESIS AND CAD TECHNIQUE: Bilateral screening digital craniocaudal and mediolateral oblique mammograms were obtained. Bilateral screening digital breast tomosynthesis was performed. The images were evaluated with computer-aided detection. COMPARISON:  Previous exam(s). ACR Breast Density Category b: There are scattered areas of fibroglandular density. FINDINGS: There are no findings suspicious for malignancy. IMPRESSION: No mammographic evidence of malignancy. A result letter of this screening mammogram will be mailed directly to the patient. RECOMMENDATION: Screening mammogram in one year. (Code:[id number]-[id number]) BI-RADS CATEGORY  1: Negative. Electronically Signed   By: Toribio Agreste M.D.   On: 11/18/2023 15:21       HISTORY OF PRESENT ILLNESS 58 y.o. patient with history of  depression, anxiety, HTN, headaches and smoking who presented initially to the [facility] ED with acute onset of left-sided numbness and facial droop, in conjunction with an occipital headache. Patient reported that she was working third shift and noticed that suddenly the left side of her body and face became numb. A friend at work noticed a left-sided facial droop as well, and she then reported to the emergency department She received IV TNK   HOSPITAL COURSE  She was admitted to the Neuro ICU for further management and workup included is below   Diagnostics  CT Head without contrast (Personally reviewed): No acute  abnormality   CT angio Head and Neck with CTP: Negative CT Perfusion. Negative for large vessel occlusion. Generally mild for age atherosclerosis in the head and neck, most pronounced at the proximal Left ICA. No hemodynamically significant stenosis. Aortic atherosclerosis  MRI brain  1. No acute intracranial abnormality. 2. Severe chronic signal changes most suggestive of advanced chronic small vessel disease. Extensive cerebral white matter involvement. Small chronic cortically based infarct in the left parietal lobe. Bilateral cerebellar and occasional deep gray nuclei involvement  LDL 104 with goal < 70- started on crestor  20mg .  A1c pending with goal < 7.0 2D echo pending  No therapy needs at this time   She was deemed appropriate and safe for discharge today.   DISCHARGE EXAM  PHYSICAL EXAM General:  Alert, well-nourished, well-developed patient in no acute distress Psych:  Mood and affect appropriate for situation CV: Regular rate and rhythm on monitor Respiratory:  Regular, unlabored respirations on room air GI: Abdomen soft and nontender  NEURO:  Mental Status: AA&Ox3  Speech/Language: speech is without dysarthria or aphasia.  Naming, repetition, fluency, and comprehension intact.  Cranial Nerves:  II: PERRL. Visual fields full.  III, IV, VI: EOMI. Eyelids elevate symmetrically.  V: Sensation is intact to light touch and symmetrical to face.  VII: Smile is symmetrical.  VIII: hearing intact to voice. IX, X: Palate elevates symmetrically. Phonation is normal.  KP:Dynloizm shrug 5/5. XII: tongue is midline without fasciculations. Motor: 5/5 strength to all muscle groups tested.  Tone: is normal  and bulk is normal Sensation- Intact to light touch bilaterally. Extinction absent to light touch to DSS. Coordination: FTN intact bilaterally, HKS: no ataxia in BLE.No drift.  Gait- deferred  NIHSS 0    Discharge Diet       Diet   Diet regular Room service  appropriate? Yes; Fluid consistency: Thin   liquids  DISCHARGE PLAN Disposition: Home aspirin  81 mg daily for secondary stroke prevention f Ongoing stroke risk factor control by Primary Care Physician at time of discharge Follow-up PCP Sherial Bail, MD in 2 weeks. Follow-up in [facility] in 8 weeks, office to schedule an appointment. Able to see NP in clinic.  50 minutes were spent preparing discharge.   Karna Geralds DNP, ACNPC-AG  [facility] I have personally obtained history,examined this patient, reviewed notes, independently viewed imaging studies, participated in medical decision making and plan of care.ROS completed by me personally and pertinent positives fully documented  I have made any additions or clarifications directly to the above note. Agree with note above.    Eather Popp, MD Medical Director [facility] Pager: [phone] 12/03/2023 4:08 PM

## 2023-12-03 NOTE — Progress Notes (Signed)
  Echocardiogram 2D Echocardiogram has been performed.  Miranda Harvey 12/03/2023, 3:22 PM

## 2023-12-03 NOTE — Evaluation (Signed)
 Physical Therapy Evaluation and DISCHARGE  Patient Details Name: Miranda Harvey MRN: [id number] DOB: Aug 15, 1965 Today's Date: 12/03/2023  History of Present Illness  Miranda Harvey is a 59 y.o. female  who presented initially to the [facility] ED with acute onset of left-sided numbness and facial droop, in conjunction with an occipital headache. Received TNK at 724am on August 11th. PMHx of depression, anxiety, HTN, headaches and smoking   Clinical Impression  Pt admitted with above. Symptoms have resolved. Pt functioning at baseline. Pt scored 23 out of 24 on DGI indicating minimal falls risk.  Pt with good home set up and support. Pt and spouse educated on [facility] FA[facility] with good carry over and verbal understanding. Pt with no further acute PT needs at this time and will not need follow up PT upon discharge. Acute PT SIGNING OFF. Please re-consult if needed in future.      If plan is discharge home, recommend the following:     Can travel by private vehicle        Equipment Recommendations None recommended by PT  Recommendations for Other Services       Functional Status Assessment Patient has had a recent decline in their functional status and demonstrates the ability to make significant improvements in function in a reasonable and predictable amount of time.     Precautions / Restrictions Precautions Precautions: None Restrictions Weight Bearing Restrictions Per Provider Order: No      Mobility  Bed Mobility Overal bed mobility: Independent             General bed mobility comments: HOB flat, no use of bed rails    Transfers Overall transfer level: Needs assistance Equipment used: None Transfers: Sit to/from Stand Sit to Stand: Supervision           General transfer comment: no difficulty, mild posterior bias upon initial stand due to being on bed rest but corrected without physical assist    Ambulation/Gait Ambulation/Gait assistance: Contact guard  assist Gait Distance (Feet): 300 Feet Assistive device: None Gait Pattern/deviations: WFL(Within Functional Limits) Gait velocity: wfl Gait velocity interpretation: >2.62 ft/sec, indicative of community ambulatory   General Gait Details: no LOB  Stairs Stairs: Yes Stairs assistance: Contact guard assist Stair Management: One rail Right, Alternating pattern, Forwards Number of Stairs: 12 General stair comments: no difficulty  Wheelchair Mobility     Tilt Bed    Modified Rankin (Stroke Patients Only) Modified Rankin (Stroke Patients Only) Pre-Morbid Rankin Score: Slight disability Modified Rankin: No significant disability     Balance Overall balance assessment: No apparent balance deficits (not formally assessed)                               Standardized Balance Assessment Standardized Balance Assessment : Dynamic Gait Index   Dynamic Gait Index Level Surface: Normal Change in Gait Speed: Normal Gait with Horizontal Head Turns: Normal Gait with Vertical Head Turns: Normal Gait and Pivot Turn: Normal Step Over Obstacle: Normal Step Around Obstacles: Normal Steps: Mild Impairment Total Score: 23       Pertinent Vitals/Pain Pain Assessment Pain Assessment: No/denies pain    Home Living Family/patient expects to be discharged to:: Private residence Living Arrangements: Spouse/significant other Available Help at Discharge: Family;Available 24 hours/day Type of Home: Mobile home Home Access: Stairs to enter Entrance Stairs-Rails: Can reach both Entrance Stairs-Number of Steps: 7   Home Layout: One level Home Equipment:  Shower seat      Prior Function Prior Level of Function : Independent/Modified Independent;Working/employed;Driving             Mobility Comments: no AD ADLs Comments: indep, works in [employer] Extremity Assessment Upper Extremity Assessment: Overall WFL for tasks assessed     Lower Extremity Assessment Lower Extremity Assessment: Overall WFL for tasks assessed    Cervical / Trunk Assessment Cervical / Trunk Assessment: Normal  Communication   Communication Communication: No apparent difficulties    Cognition Arousal: Alert Behavior During Therapy: WFL for tasks assessed/performed   PT - Cognitive impairments: No apparent impairments                         Following commands: Intact (increased time to process multistep)       Cueing Cueing Techniques: Verbal cues     General Comments General comments (skin integrity, edema, etc.): VSS    Exercises     Assessment/Plan    PT Assessment Patient does not need any further PT services  PT Problem List         PT Treatment Interventions      PT Goals (Current goals can be found in the Care Plan section)  Acute Rehab PT Goals Patient Stated Goal: home PT Goal Formulation: All assessment and education complete, DC therapy    Frequency       Co-evaluation               AM-PAC PT 6 Clicks Mobility  Outcome Measure Help needed turning from your back to your side while in a flat bed without using bedrails?: None Help needed moving from lying on your back to sitting on the side of a flat bed without using bedrails?: None Help needed moving to and from a bed to a chair (including a wheelchair)?: None Help needed standing up from a chair using your arms (e.g., wheelchair or bedside chair)?: None Help needed to walk in hospital room?: None Help needed climbing 3-5 steps with a railing? : A Little 6 Click Score: 23    End of Session Equipment Utilized During Treatment: Gait belt Activity Tolerance: Patient tolerated treatment well Patient left: in chair;with call bell/phone within reach;with family/visitor present Nurse Communication: Mobility status (pt cleared to amb with spouse once RN disconnects lines and monitor) PT Visit Diagnosis: Other symptoms and signs  involving the nervous system (R29.898)    Time: 9172-9144 PT Time Calculation (min) (ACUTE ONLY): 28 min   Charges:   PT Evaluation $PT Eval Moderate Complexity: 1 Mod PT Treatments $Gait Training: 8-22 mins PT General Charges $$ ACUTE PT VISIT: 1 Visit         Norene Ames, PT, DPT Acute Rehabilitation Services Secure chat preferred Office #: [phone]   Norene CHRISTELLA Ames 12/03/2023, 9:20 AM

## 2023-12-04 LAB — ECHOCARDIOGRAM COMPLETE
AR max vel: 1.51 cm2
AV Area VTI: 1.61 cm2
AV Area mean vel: 1.66 cm2
AV Mean grad: 15.5 mmHg
AV Peak grad: 29.7 mmHg
Ao pk vel: 2.73 m/s
Area-P 1/2: 4.21 cm2
Calc EF: 56.8 %
Height: 66 in
S' Lateral: 3.3 cm
Single Plane A2C EF: 57.2 %
Single Plane A4C EF: 51.7 %
Weight: 2172.85 [oz_av]
